# Patient Record
Sex: Male | Born: 1950 | Race: White | Hispanic: No | Marital: Married | Smoking: Never smoker
Health system: Southern US, Academic
[De-identification: ages and names within clinical notes are randomized; demographics above are authoritative.]

## PROBLEM LIST (undated history)

## (undated) DIAGNOSIS — I639 Cerebral infarction, unspecified: Secondary | ICD-10-CM

## (undated) DIAGNOSIS — E782 Mixed hyperlipidemia: Secondary | ICD-10-CM

## (undated) DIAGNOSIS — I10 Essential (primary) hypertension: Secondary | ICD-10-CM

## (undated) HISTORY — PX: HX HERNIA REPAIR: SHX51

---

## 1987-08-03 ENCOUNTER — Inpatient Hospital Stay (HOSPITAL_COMMUNITY): Payer: Self-pay

## 2015-08-27 IMAGING — CR XRAY LUMBAR SPINE COMPLETE
1 series · 4 of 4 positions shown · non-contrast
Comparison: None.

Exam:   
Lumbar spine 4V
INDICATION: Back pain.

[Series 1: view not recorded · oblique · 0.17mm/px · 4 of 4 slices shown]
[im 1/4]
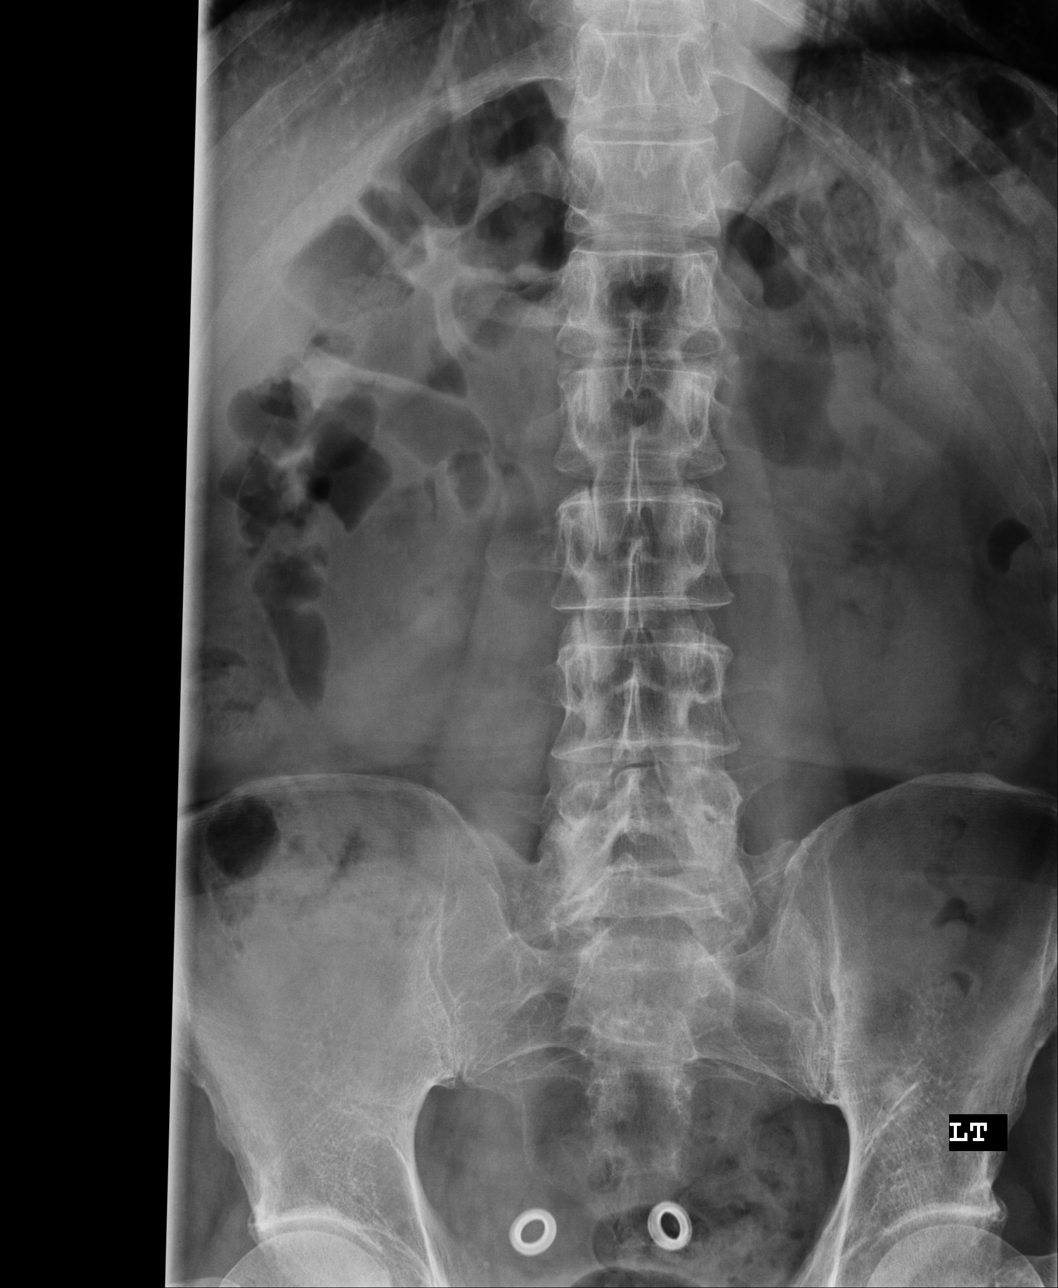
[im 2/4]
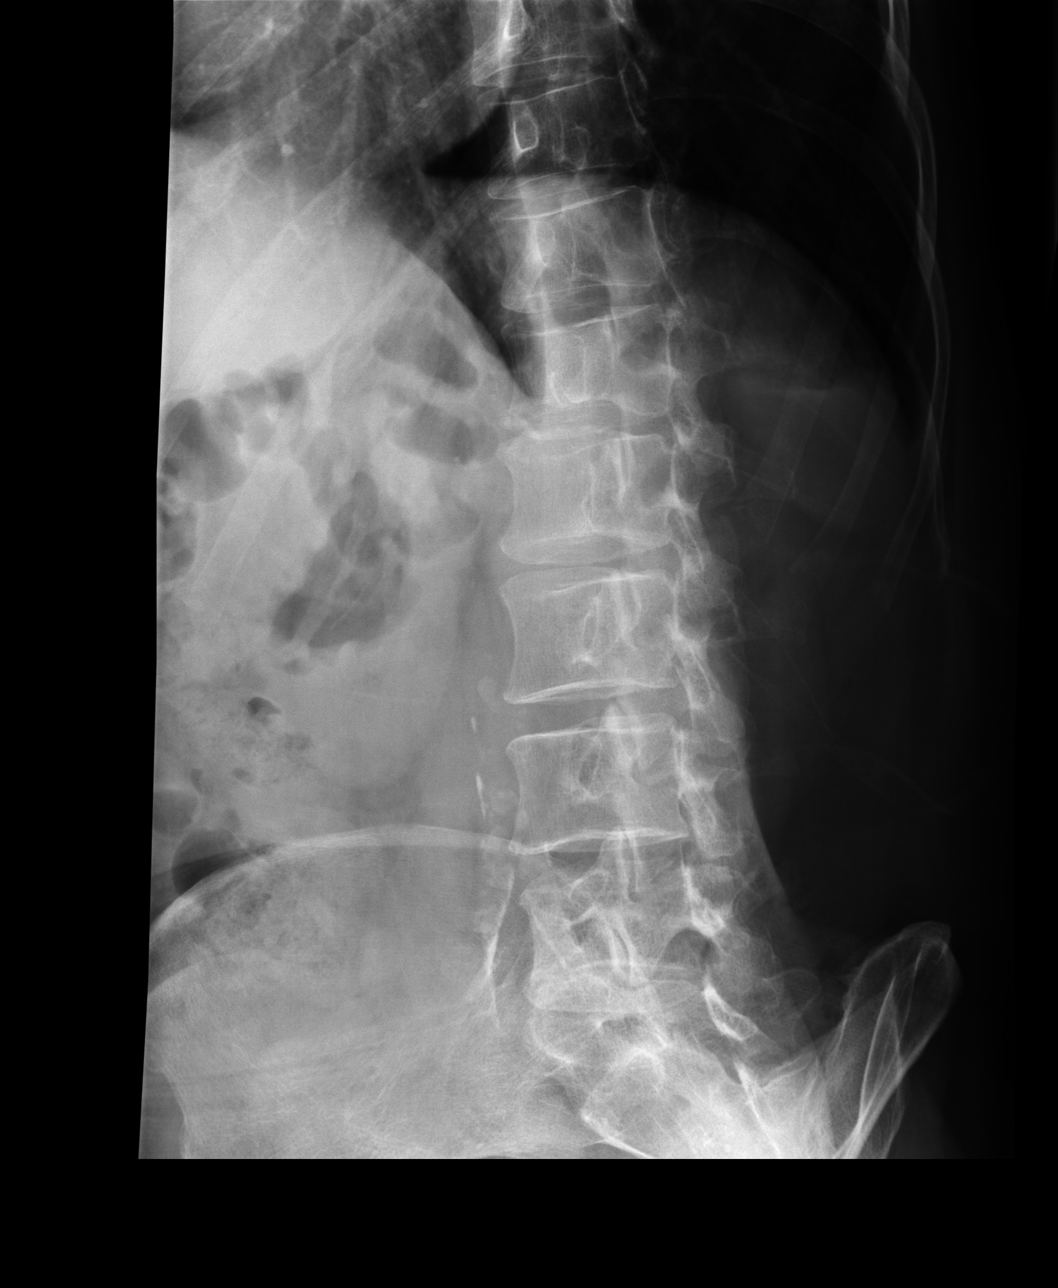
[im 3/4]
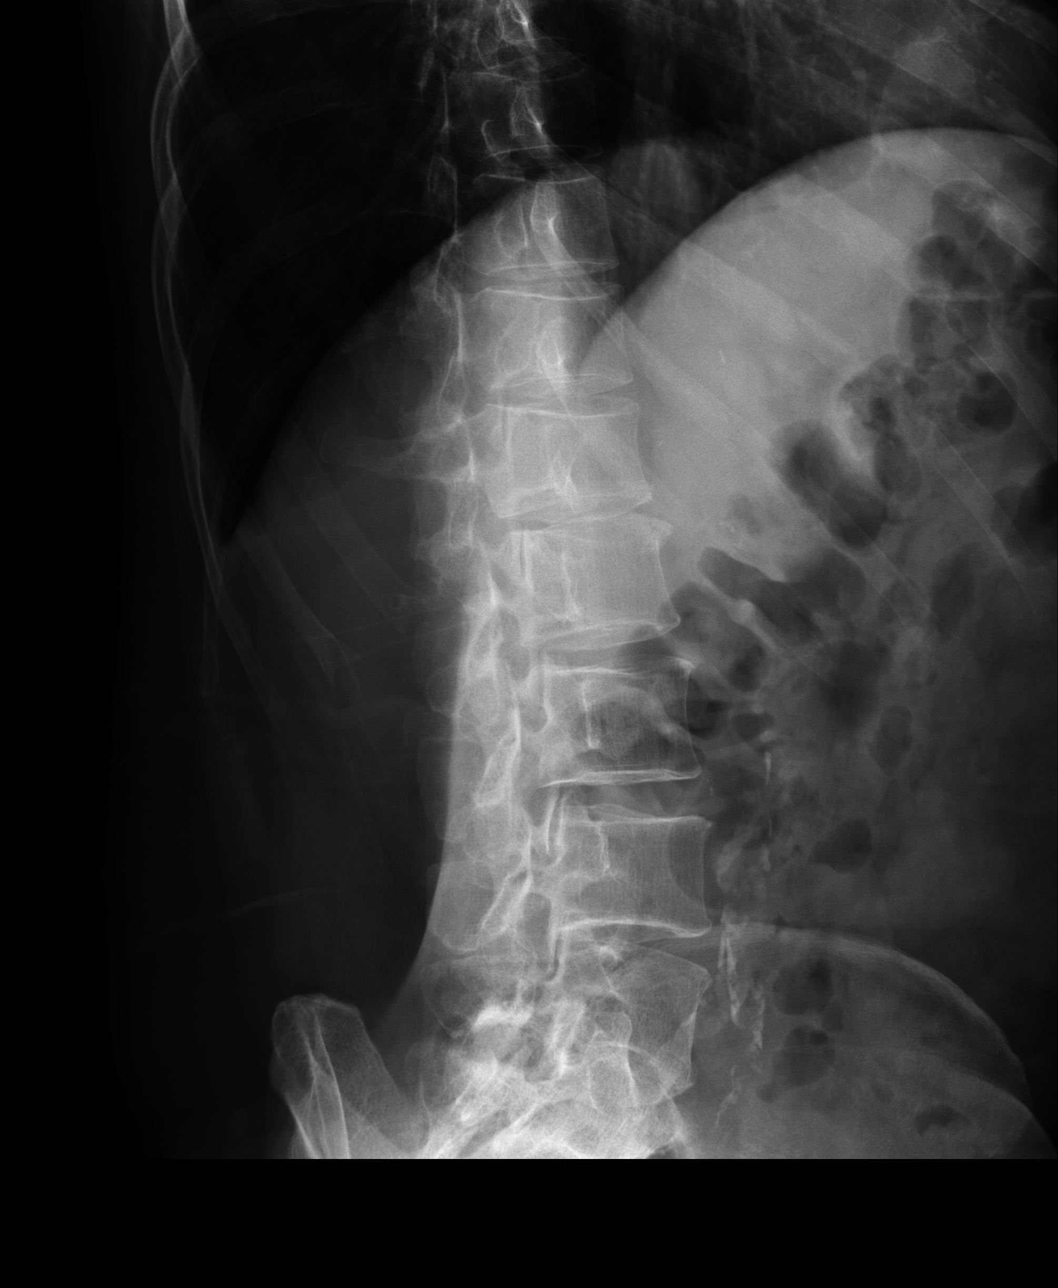
[im 4/4]
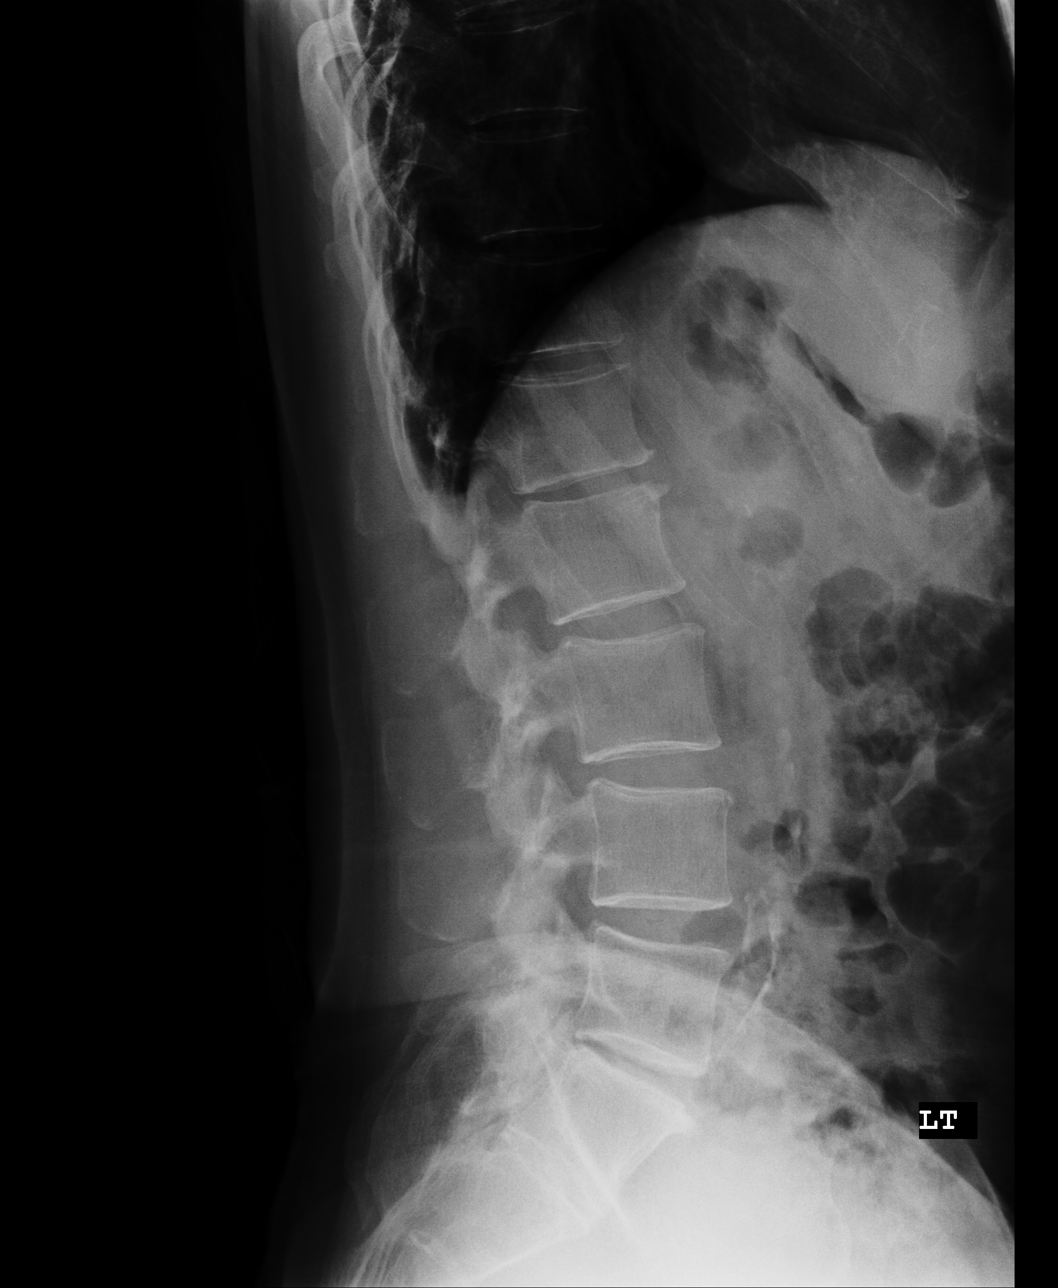

[4 of 4 positions shown; findings below may reference images not displayed]

FINDINGS: There is no acute fracture or subluxation. Mild to moderate disc space narrowing is noted at L1-2 level and moderate disc space narrowing is noted at L5-S1 level. There is also mild facet arthropathy within the lower lumbar spine. There is no definite pars defect on the oblique views. Paraspinal soft tissues are unremarkable. There are extensive vascular calcifications.
IMPRESSION: 1.
Arthritic changes as detailed above. Please consider further evaluation with MRI for persistent or worsening symptoms.

## 2018-01-04 IMAGING — US US ABDOMEN COMPLETE
1 series · 14 of 25 positions shown · non-contrast
Comparison: None available.

EXAM:  US ABDOMEN COMPLETE
INDICATION: Mid abdominal pain.

[Series 4: us abdomen complete · 0.29mm/px · 14 of 50 slices shown]
[im 1/50]
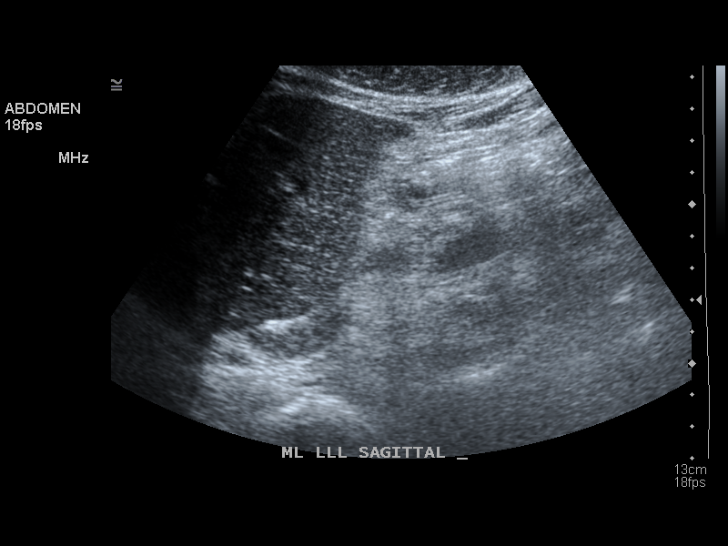
[im 5/50]
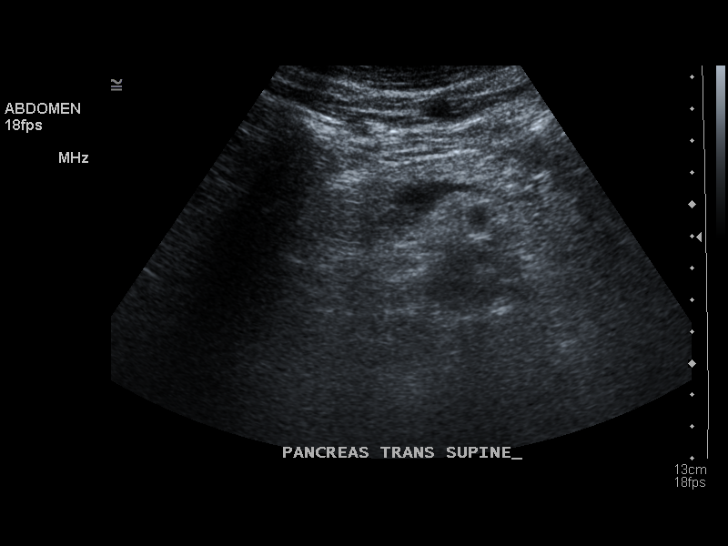
[im 9/50]
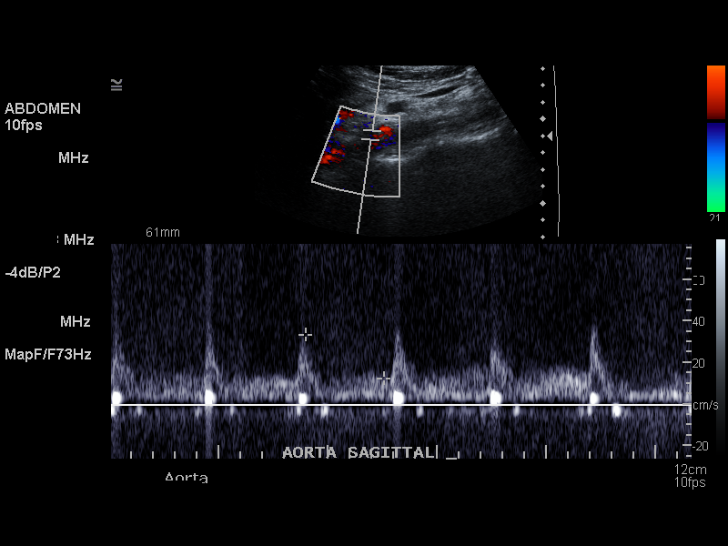
[im 13/50]
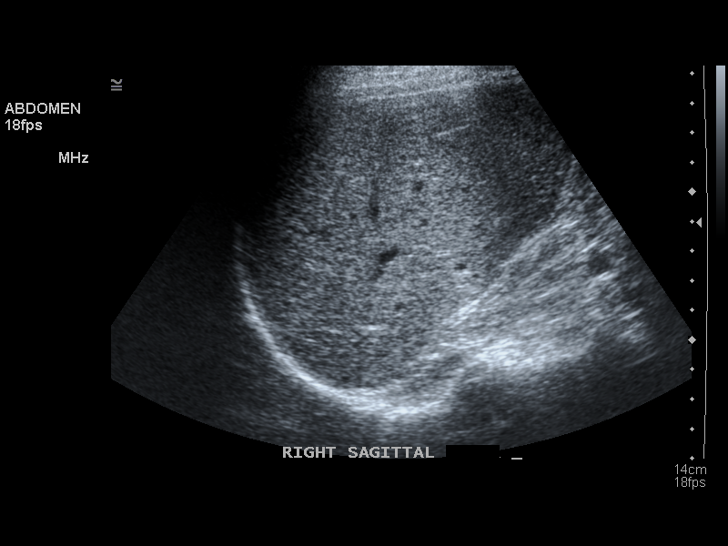
[im 17/50]
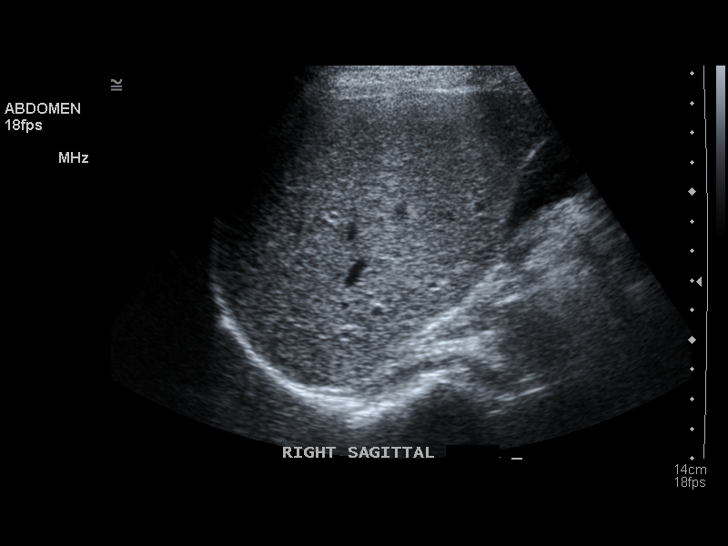
[im 19/50]
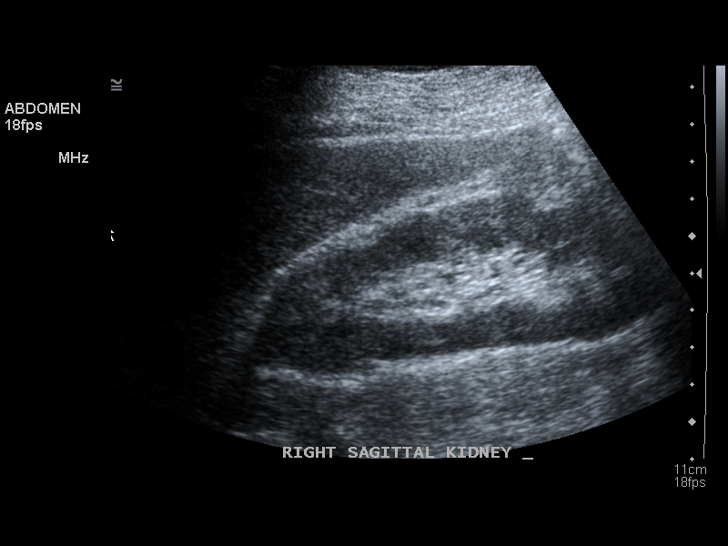
[im 23/50]
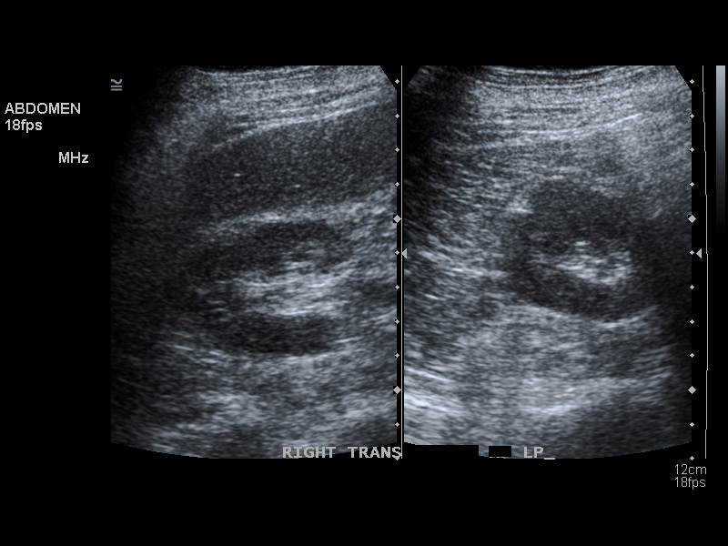
[im 27/50]
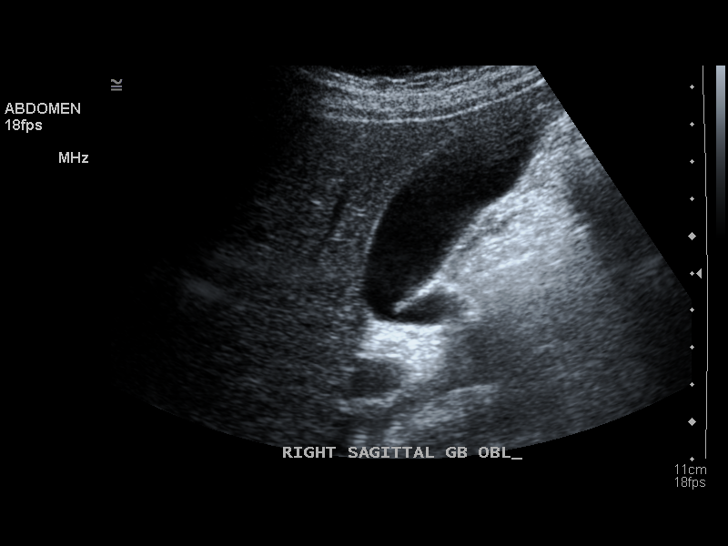
[im 31/50]
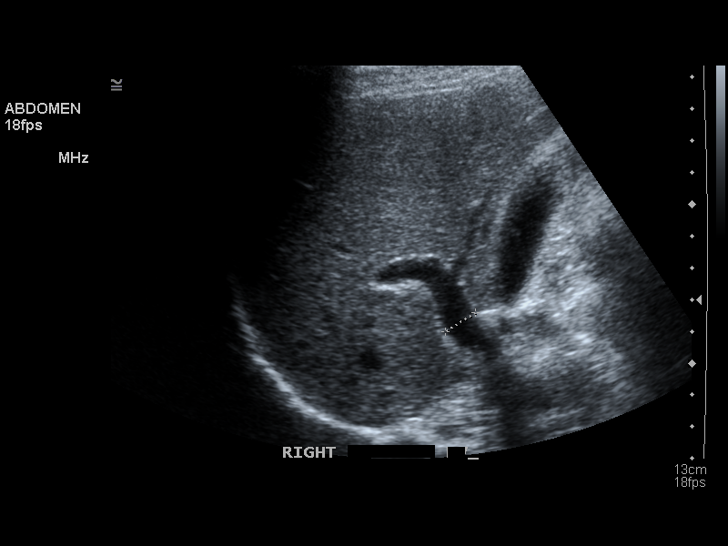
[im 33/50]
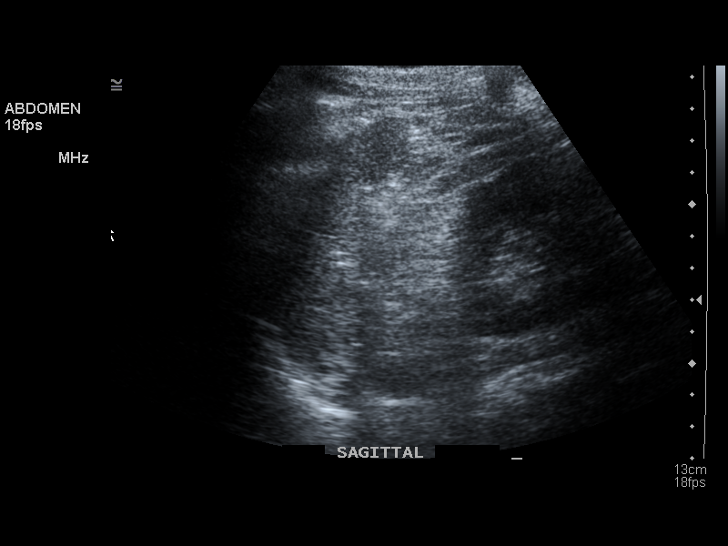
[im 37/50]
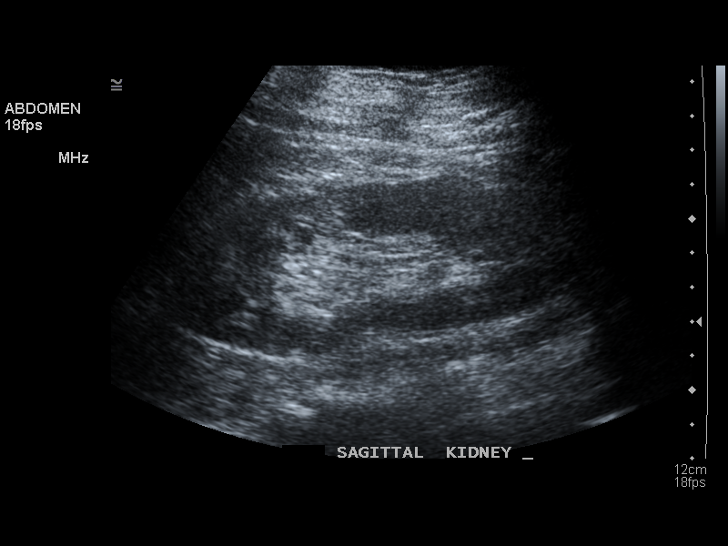
[im 41/50]
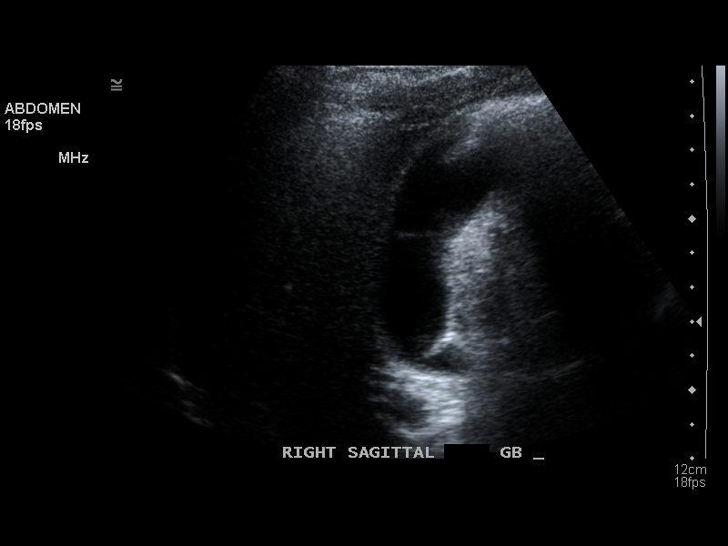
[im 45/50]
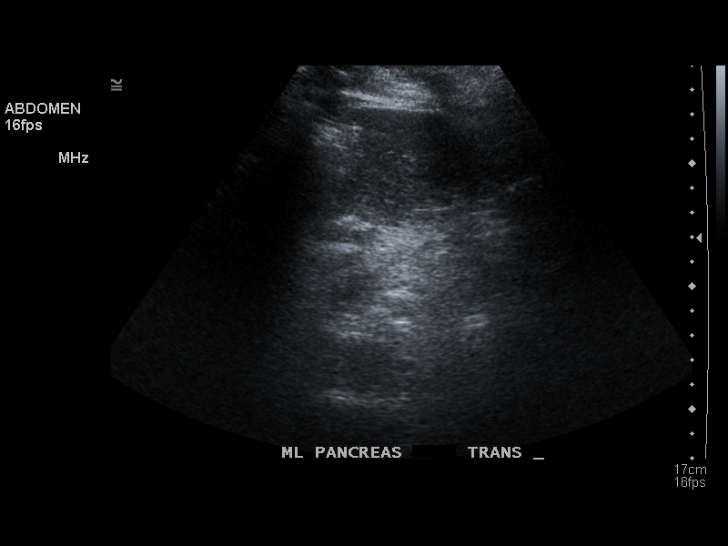
[im 50/50]
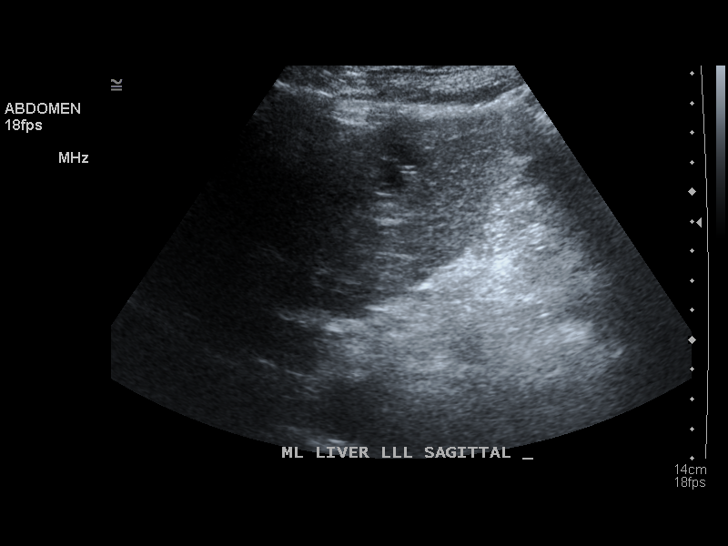

[14 of 25 positions shown; findings below may reference images not displayed]

FINDINGS: Liver is normal in echogenicity. There is an 18 mm inferior right hepatic lobe septated cyst. There is no intra or extrahepatic biliary ductal dilatation. Common bile duct measures 3 mm. No sludge or shadowing gallstones are seen. There is no gallbladder wall thickening or pericholecystic fluid. Pancreas is incompletely visualized due to artifact from overlying bowel gas. Spleen measures 9 cm and is unremarkable.

Kidneys are normal in echogenicity and measure 11 cm bilaterally. There is no hydronephrosis, mass or cyst on either side.

Visualized abdominal aorta is without aneurysmal dilatation. IVC is normal. There is no ascites.
IMPRESSION: 1. A septated 18 mm inferior right hepatic lobe cyst. 

2. No evidence of cholelithiasis or acute cholecystitis. 

3. Pancreas incompletely visualized due to artifact from overlying bowel gas.

## 2018-01-04 IMAGING — CR XRAY UPPER GI TRACT W/SM INT [PERSON_NAME]/[PERSON_NAME]
1 series · 7 of 7 positions shown · non-contrast
Comparison: None available.

EXAM:  XRAY UPPER GI TRACT W/SM INT KLEVER/[id number]
INDICATION: Abdominal pain and bloating.

[Series 1: view not recorded · 0.17mm/px · 7 of 7 slices shown]
[im 1/7]
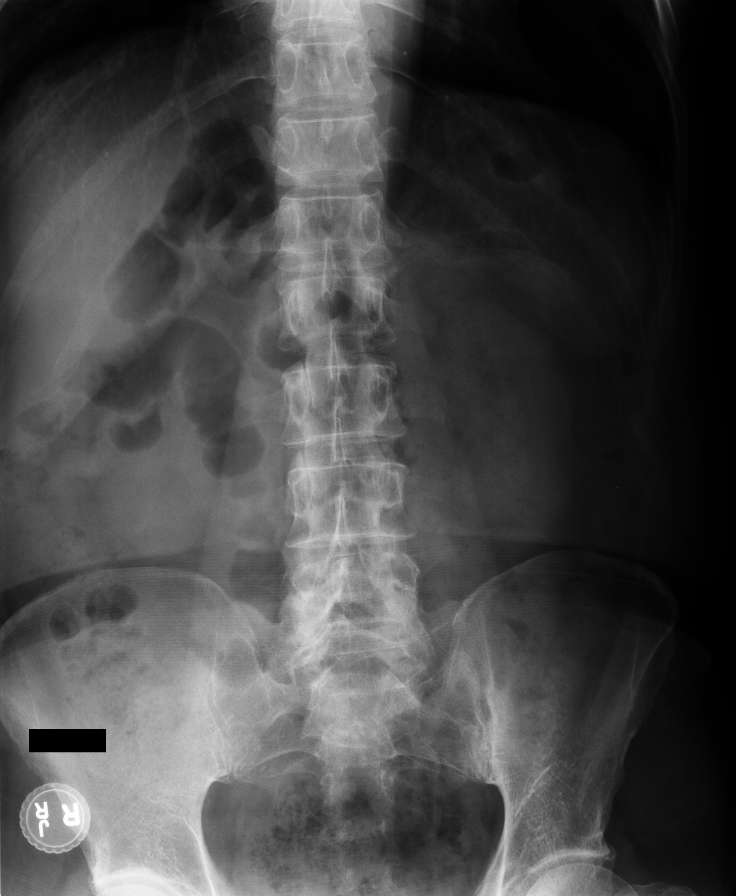
[im 2/7]
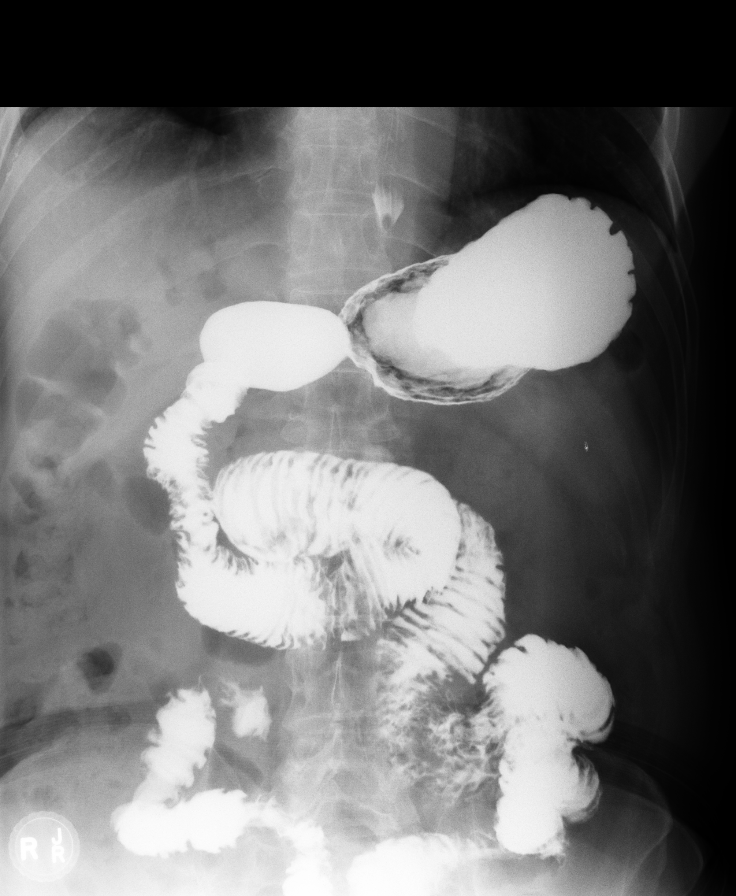
[im 3/7]
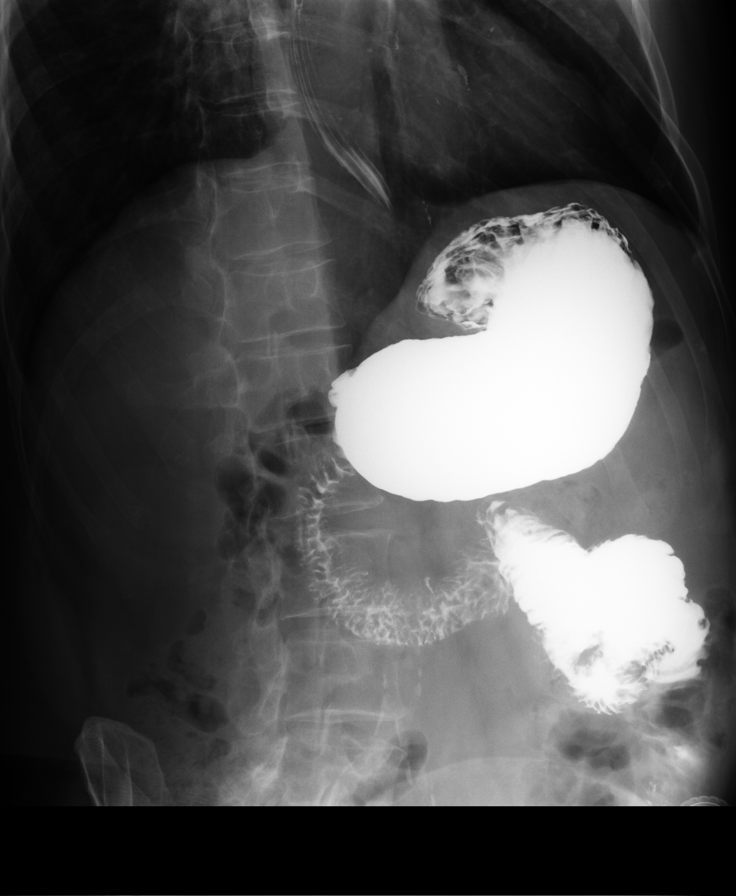
[im 4/7]
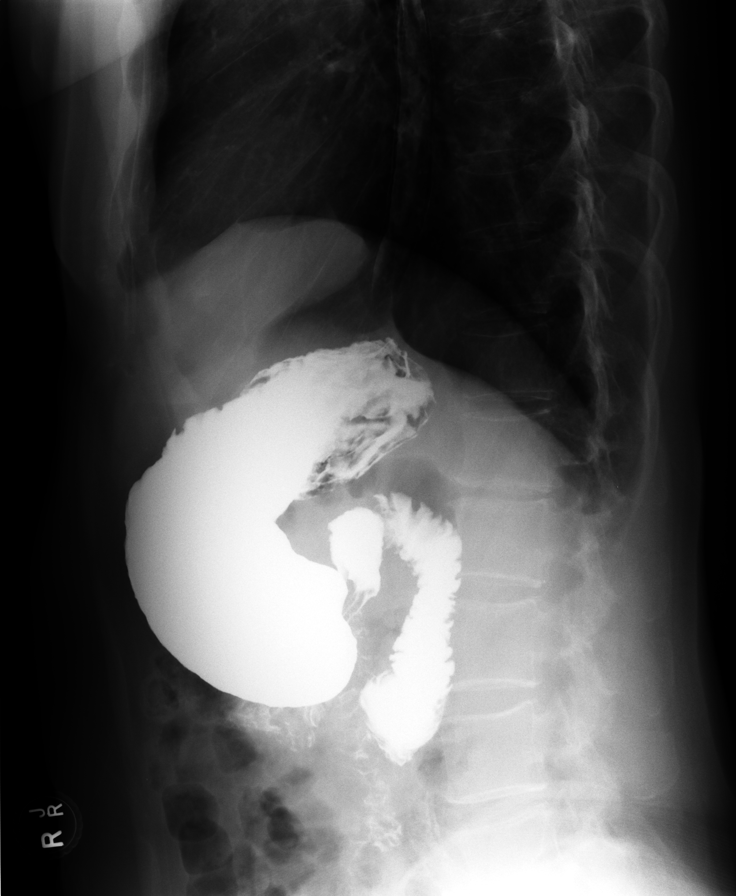
[im 5/7]
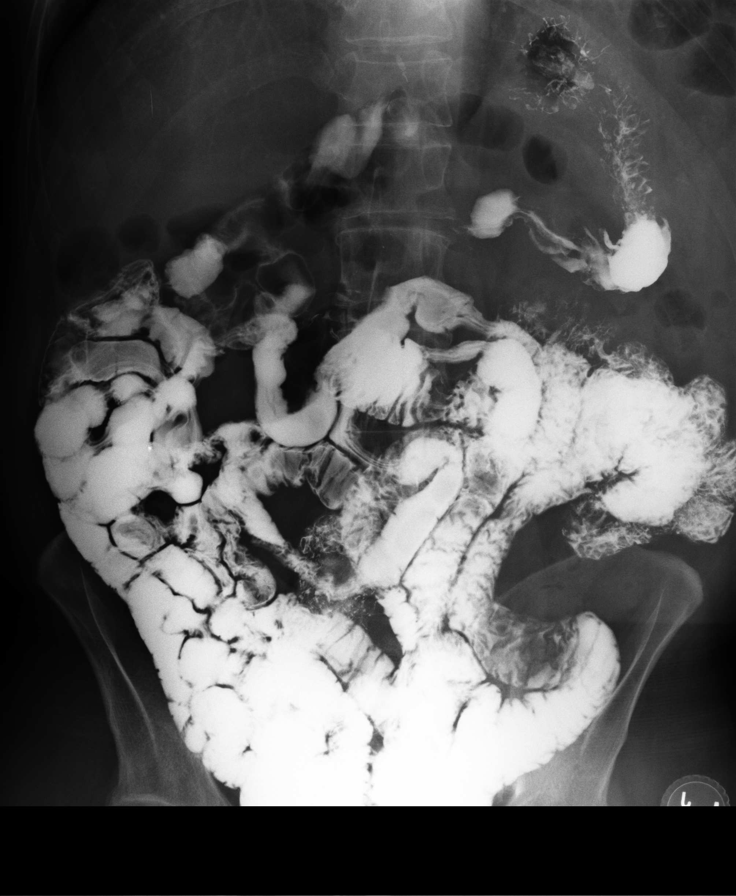
[im 6/7]
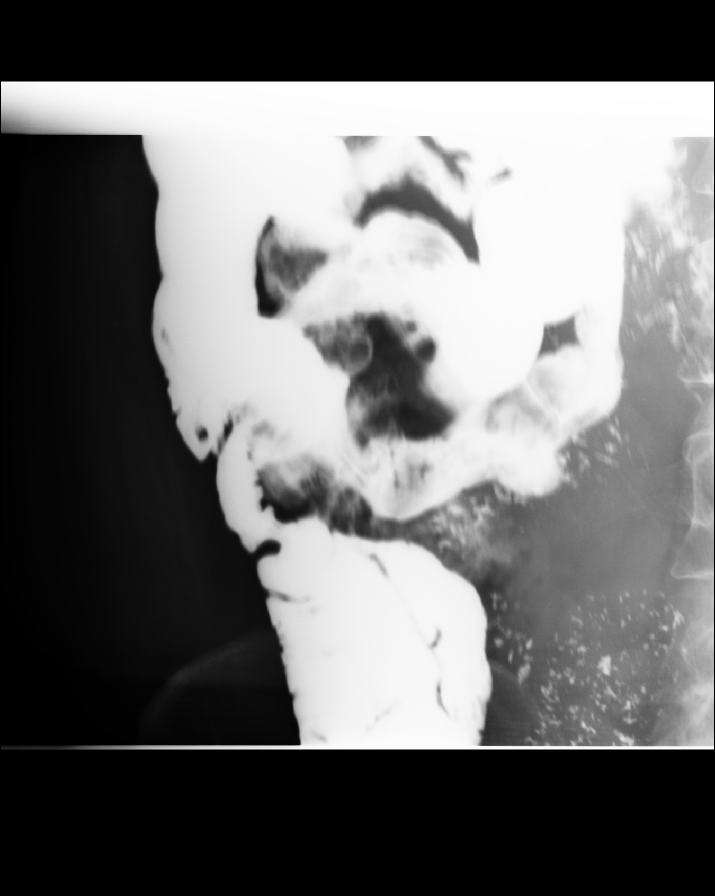
[im 7/7]
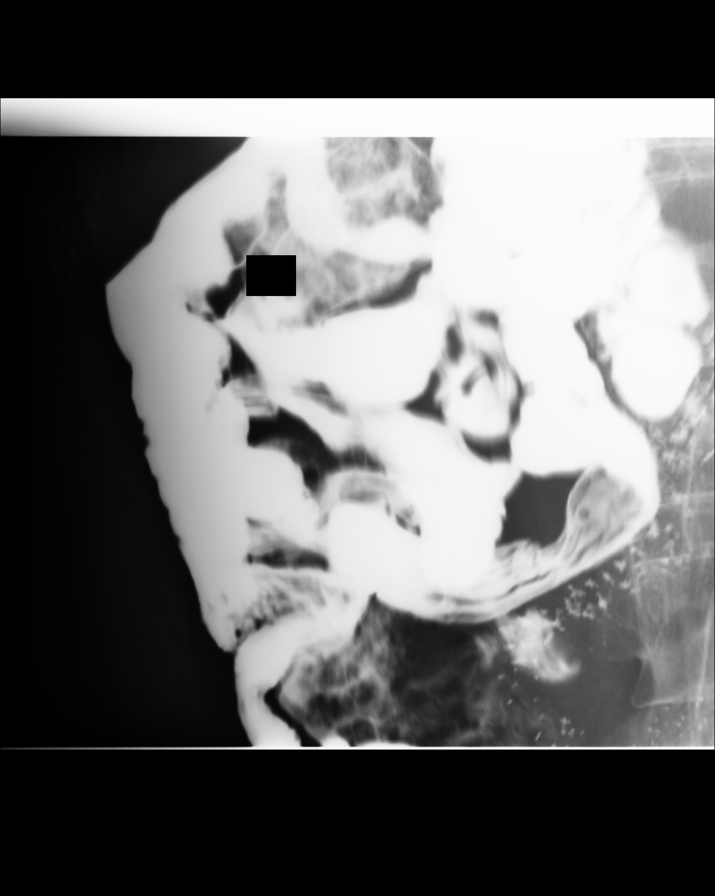

[7 of 7 positions shown; findings below may reference images not displayed]

FINDINGS: A double contrast upper gastrointestinal series was performed with barium and air administered orally under fluoroscopic control.

The esophageal is unremarkable in appearance and peristaltic activity. There is no mass, ulcer or stricture. There is no evidence of hiatal hernia. No gastroesophageal reflux is noted during the exam.

The stomach has normal mucosal fold pattern without mass or ulceration. The duodenal bulb is also normal in configuration without ulceration.

Sequential films were obtained through the small bowel which demonstrate a normal transit time. The mucosal pattern is unremarkable and there are no abnormally dilated or separated bowel loops. No abnormally matted or angulated small bowel loops are seen. The terminal ileum has a normal appearance.

Fluoroscopy time is 1.5 min.
IMPRESSION: Unremarkable exam.

## 2019-01-17 IMAGING — US US ABDOMEN COMPLETE
1 series · 14 of 25 positions shown · non-contrast
Comparison: Abdominal sonogram dated 01/04/2018.

EXAM:  US ABDOMEN COMPLETE
INDICATION: Hepatic cyst.

[Series 4: us abdomen complete · 0.20mm/px · 14 of 58 slices shown]
[im 1/58]
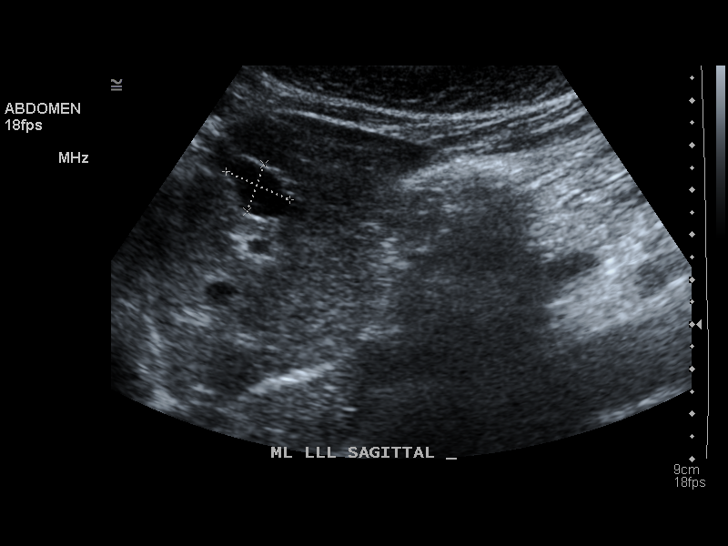
[im 5/58]
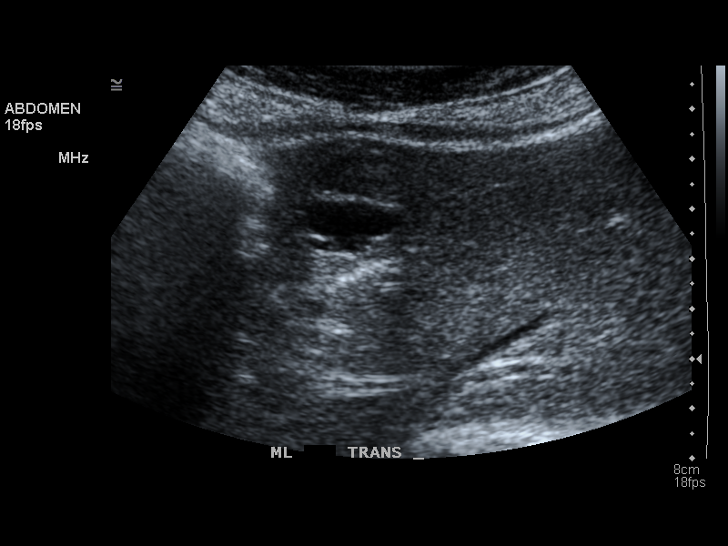
[im 10/58]
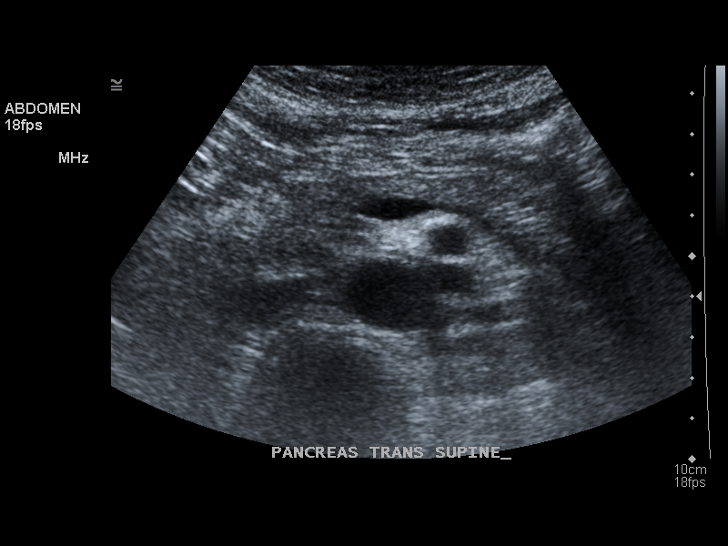
[im 15/58]
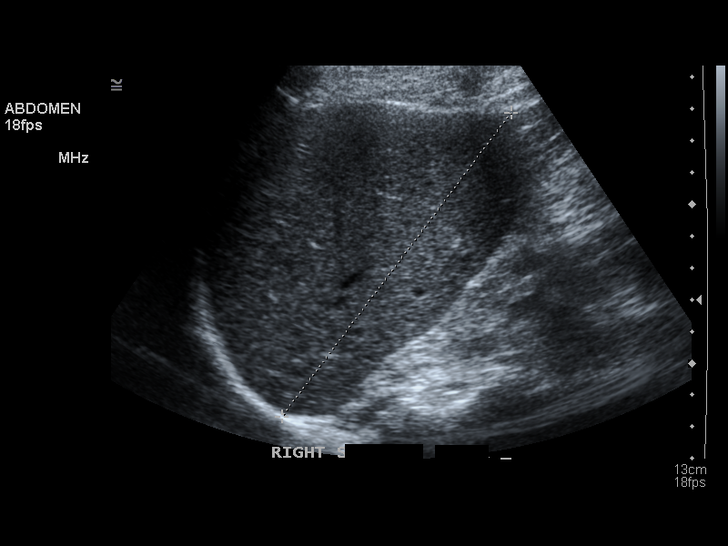
[im 20/58]
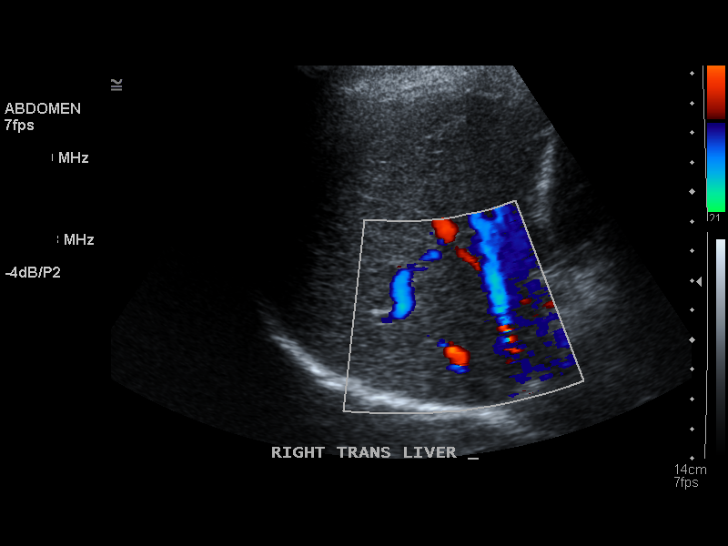
[im 22/58]
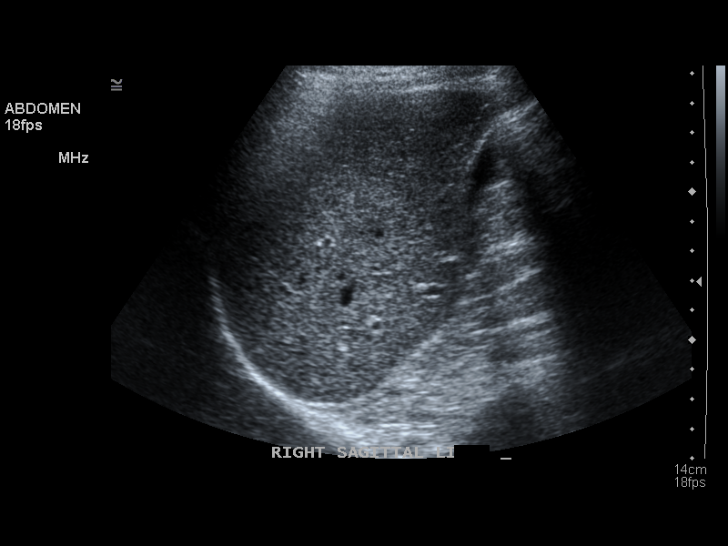
[im 27/58]
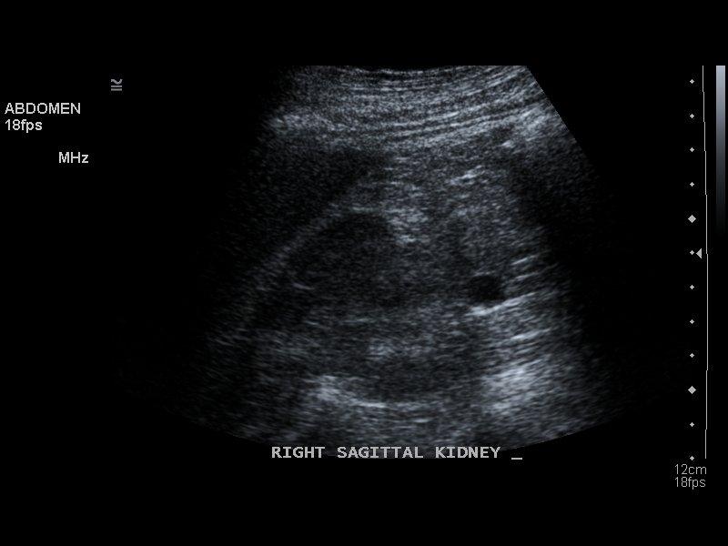
[im 31/58]
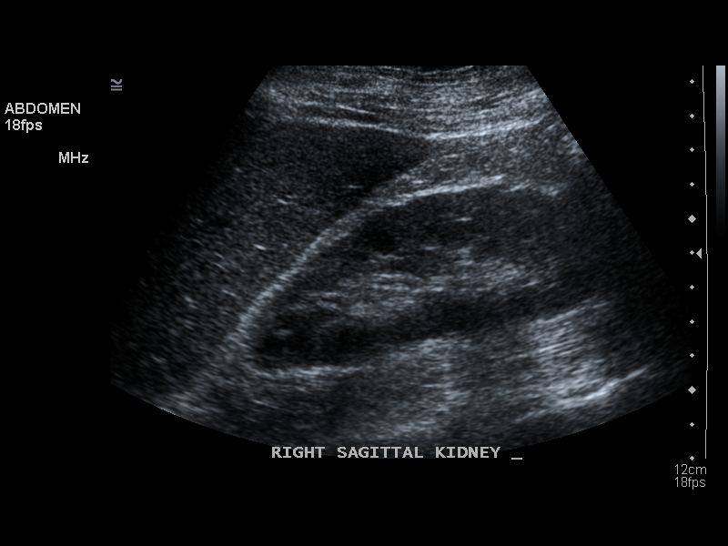
[im 36/58]
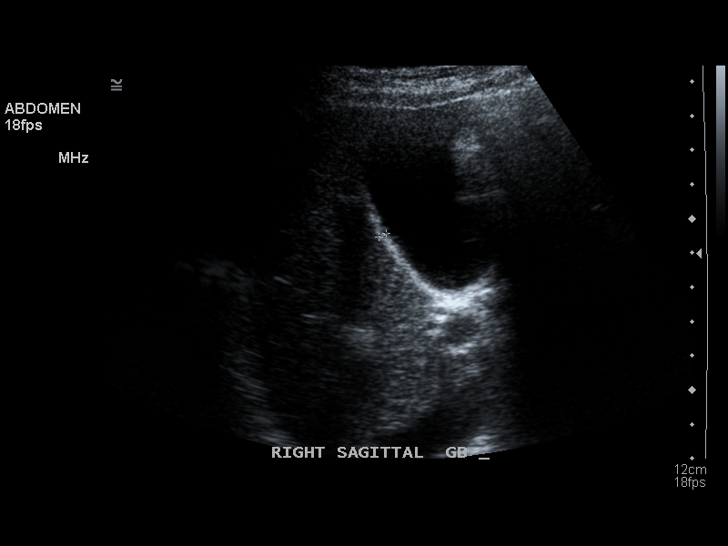
[im 39/58]
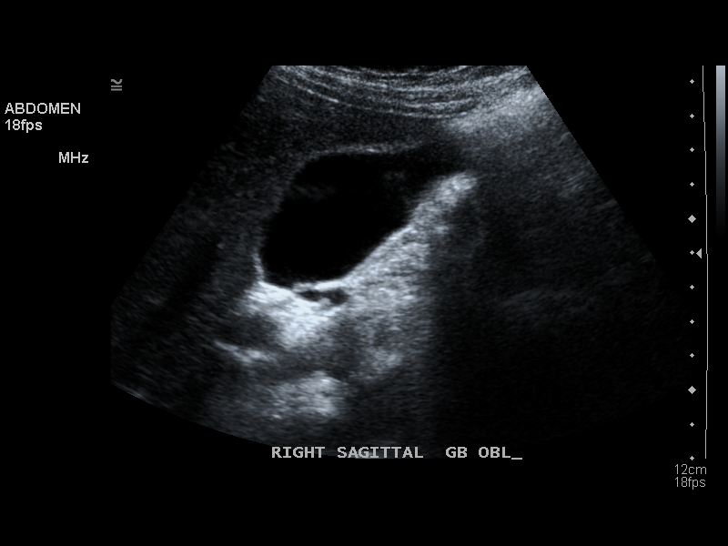
[im 43/58]
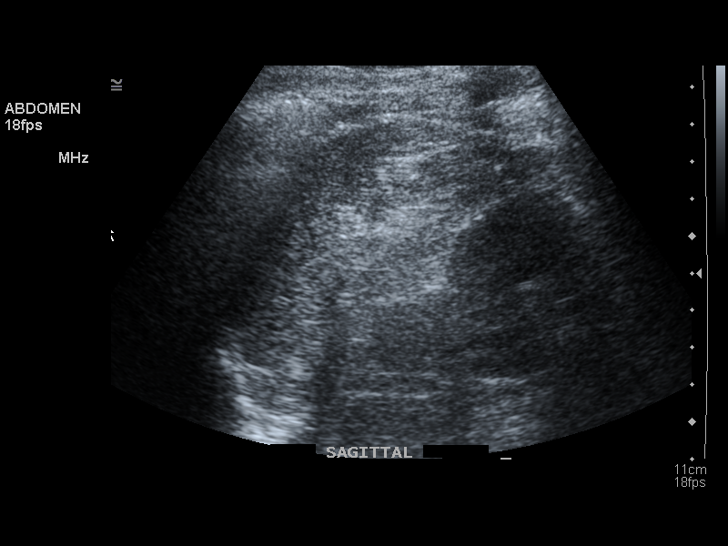
[im 48/58]
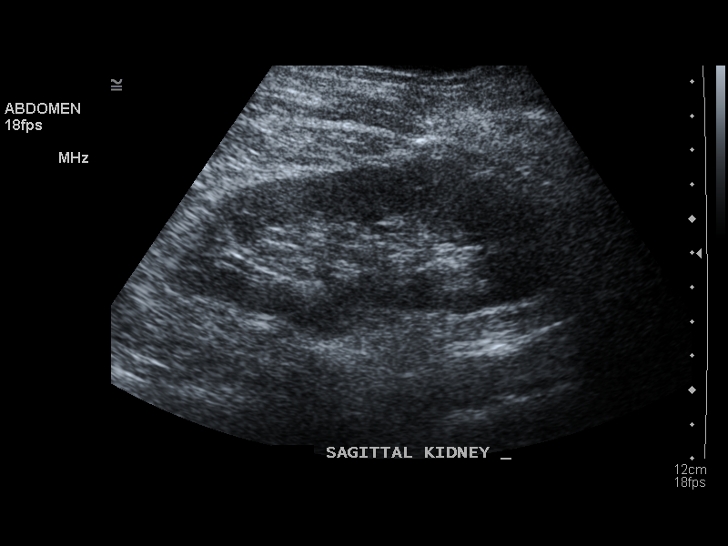
[im 53/58]
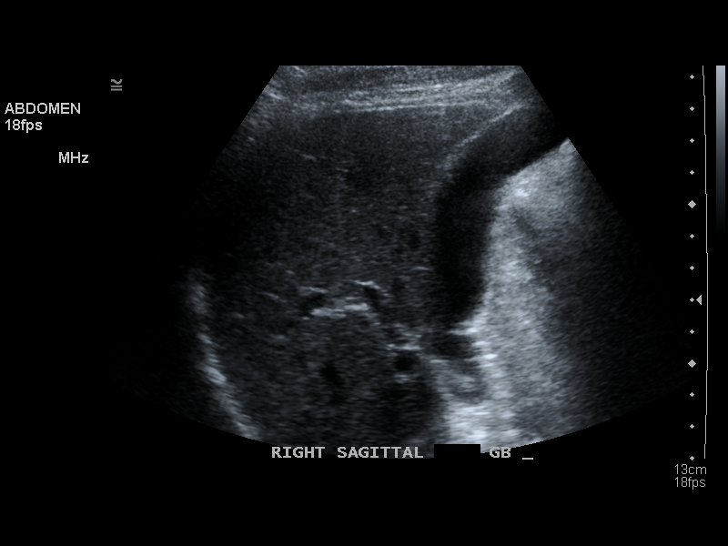
[im 58/58]
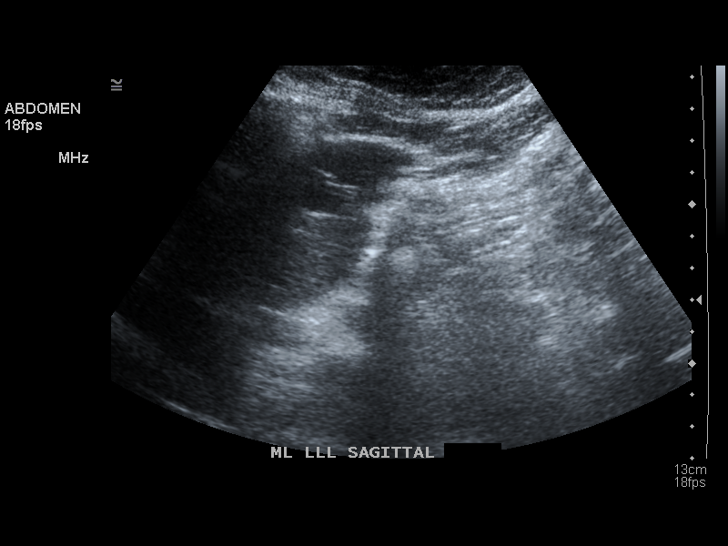

[14 of 25 positions shown; findings below may reference images not displayed]

FINDINGS: Liver is normal in echogenicity. A 20 mm septated hepatic cyst is stable. There is no intra or extrahepatic biliary ductal dilatation. Common bile duct measures 3 mm. No sludge or shadowing gallstones are seen. There is no gallbladder wall thickening or pericholecystic fluid. Pancreas is incompletely visualized due to artifact from overlying bowel gas. Spleen measures 7.5 cm and is unremarkable.

Kidneys are normal in echogenicity. Right kidney measures 13 cm and left kidney measures 12 cm. There is a 10 mm right kidney interpolar region cyst.

Visualized abdominal aorta is without aneurysmal dilatation. IVC is normal. There is no ascites.
IMPRESSION: 1. Stable 20 mm septated hepatic cyst. 

2. No evidence of cholelithiasis or acute cholecystitis. 

3. Pancreas incompletely visualized due to artifact from overlying bowel gas.

## 2020-01-20 IMAGING — US US ABDOMEN COMPLETE
1 series · 14 of 25 positions shown · non-contrast
Comparison: None.

EXAM:  US ABDOMEN COMPLETE
INDICATION: Hepatic cyst.

[Series 4: us abdomen complete · 0.22mm/px · 14 of 62 slices shown]
[im 1/62]
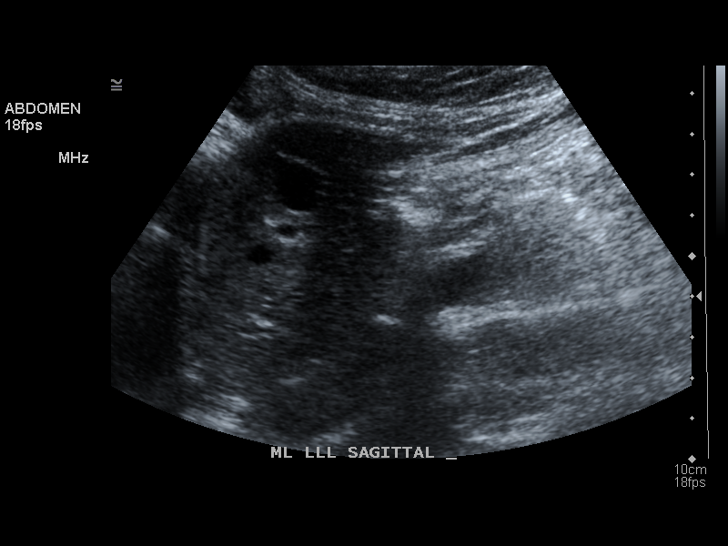
[im 6/62]
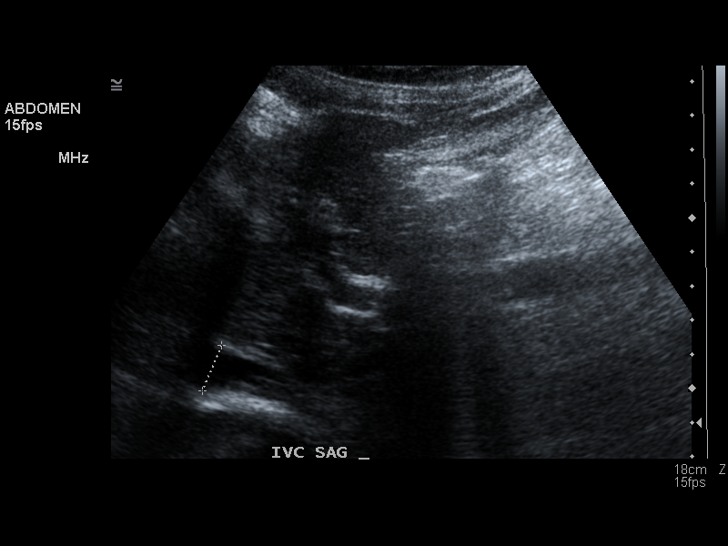
[im 11/62]
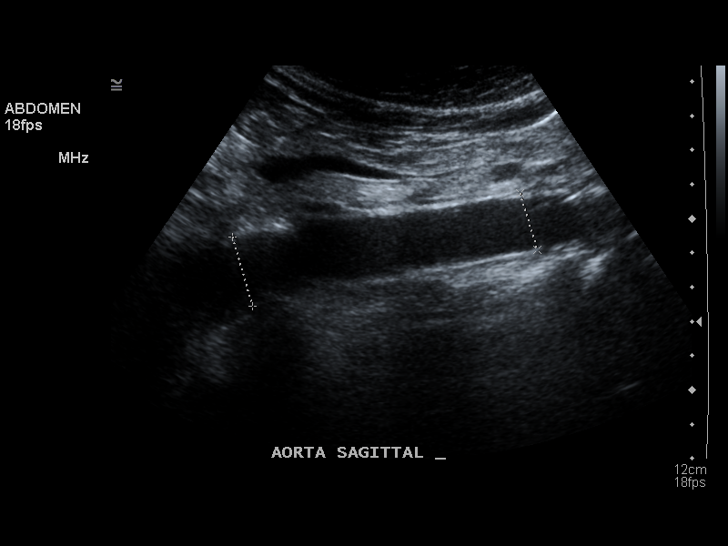
[im 16/62]
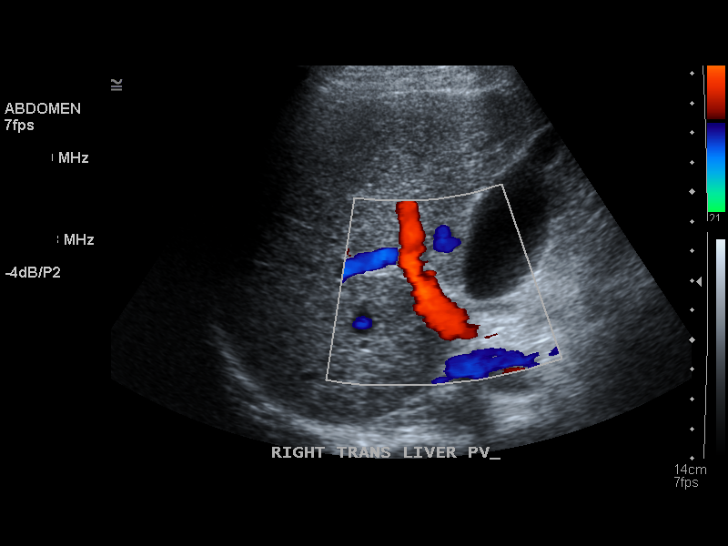
[im 21/62]
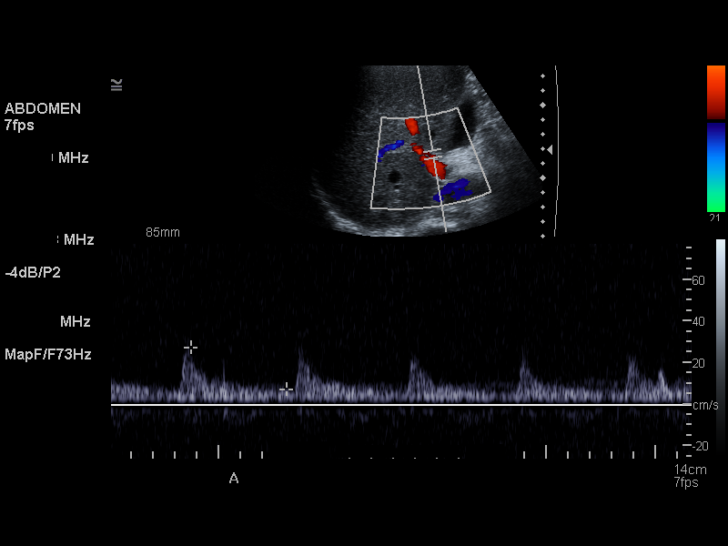
[im 23/62]
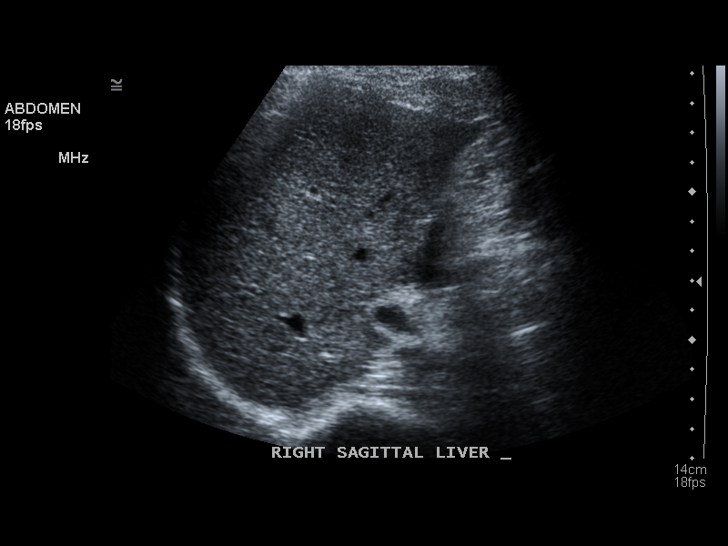
[im 28/62]
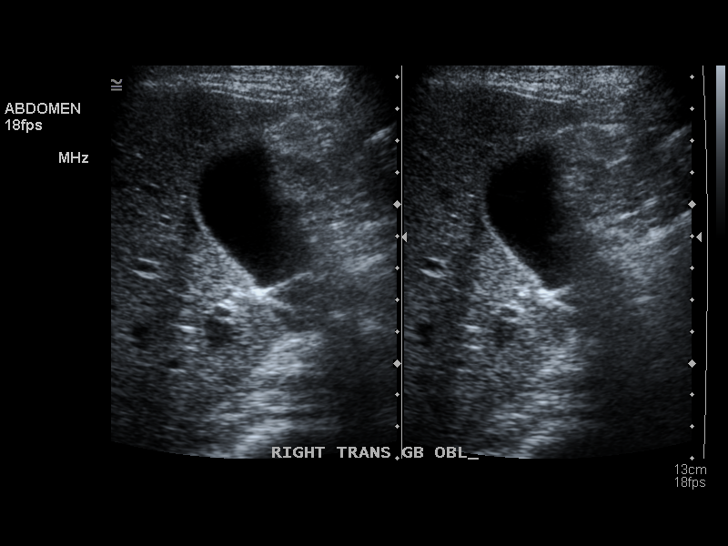
[im 34/62]
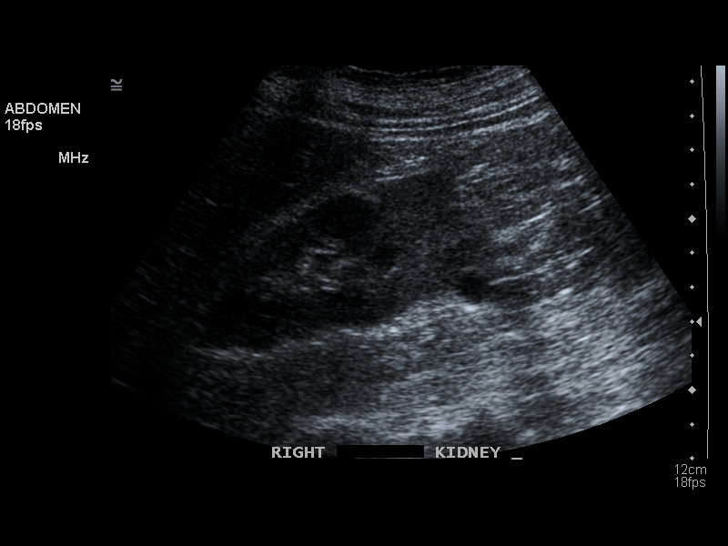
[im 39/62]
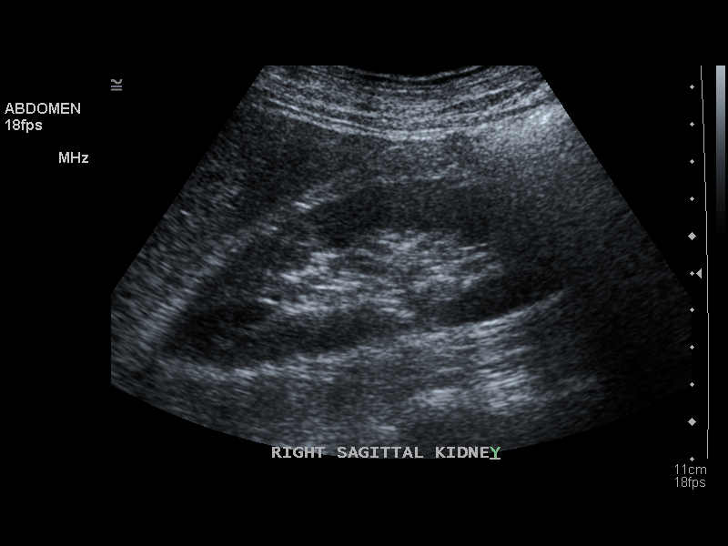
[im 41/62]
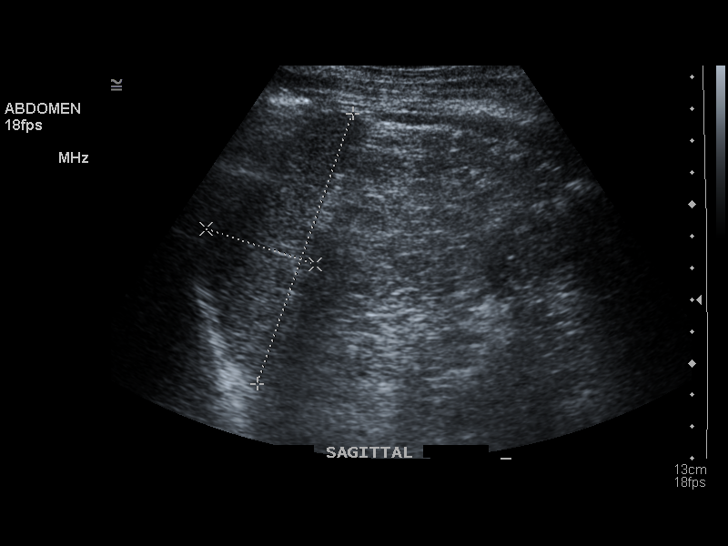
[im 46/62]
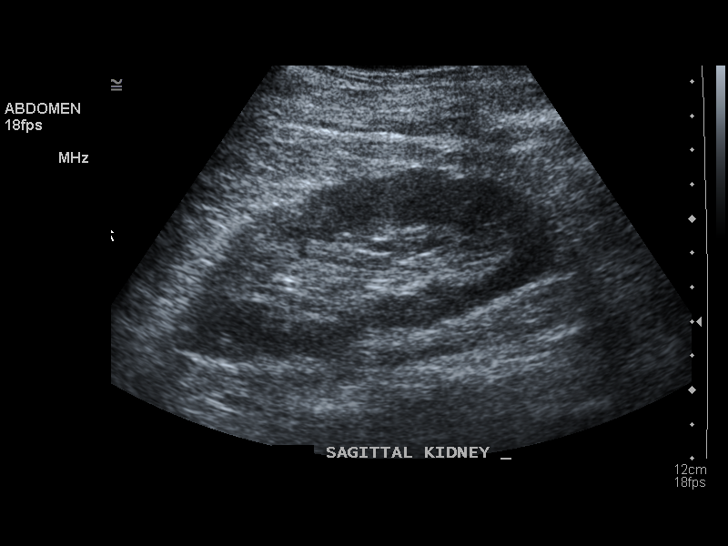
[im 51/62]
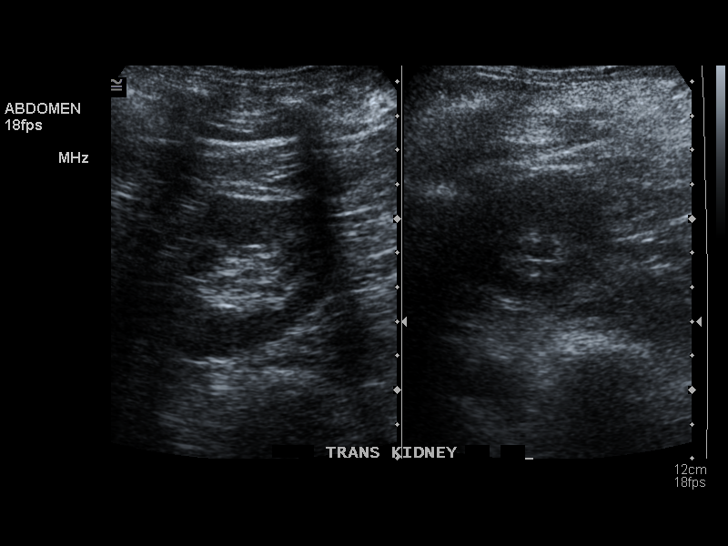
[im 56/62]
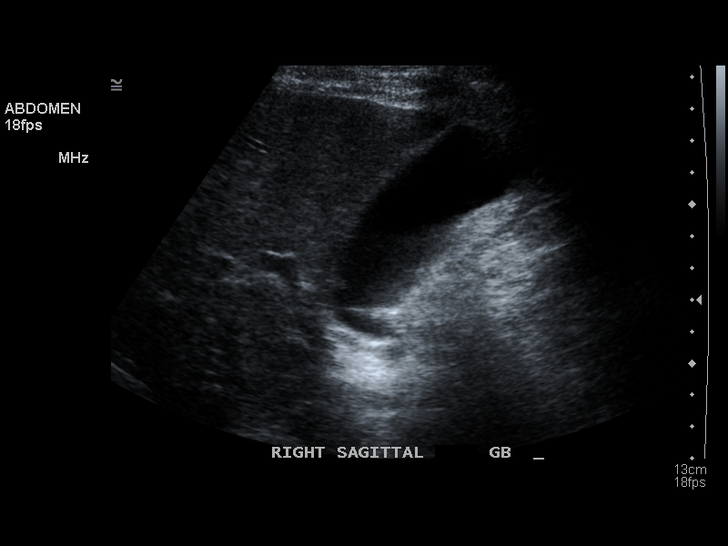
[im 62/62]
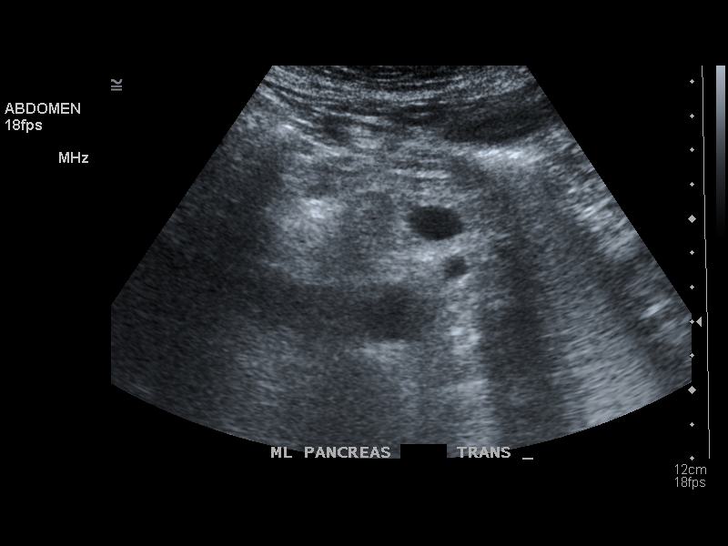

[14 of 25 positions shown; findings below may reference images not displayed]

FINDINGS: The liver measures 12.4 cm.  In the left lobe of the liver there is a 1.5 x 1.2 x 1.8 cm septated cyst.  The portal vein measures 1.4 cm and shows hepatopetal flow.  No echogenic filling defects are seen in the gallbladder.  The gallbladder wall is 0.13 cm in thickness.  The caliber of the common bile duct is 0.3 cm.  Supine views of the pancreas show the pancreas to be normal in size, shape and contour.  The caliber of the aorta and inferior vena cava is normal where measured.  The spleen measures 9 cm.  The right kidney measures 11 cm and in the lower pole there is a 1.2 x 1.0 x 1.0 cm cyst.  The left kidney measures 11.6 cm.  No hydronephrosis is observed.
IMPRESSION: Small cyst in the left lobe of the liver and in the lower pole of the right kidney. 

Otherwise, unremarkable abdominal ultrasound.

## 2021-02-07 IMAGING — US US ABDOMEN COMPLETE
1 series · 14 of 25 positions shown · non-contrast
Comparison: Sonogram dated 01/20/2020.

﻿EXAM: US ABDOMEN COMPLETE
INDICATION: History of liver cysts.

[Series 1: us abdomen complete · 14 of 66 slices shown]
[im 1/66]
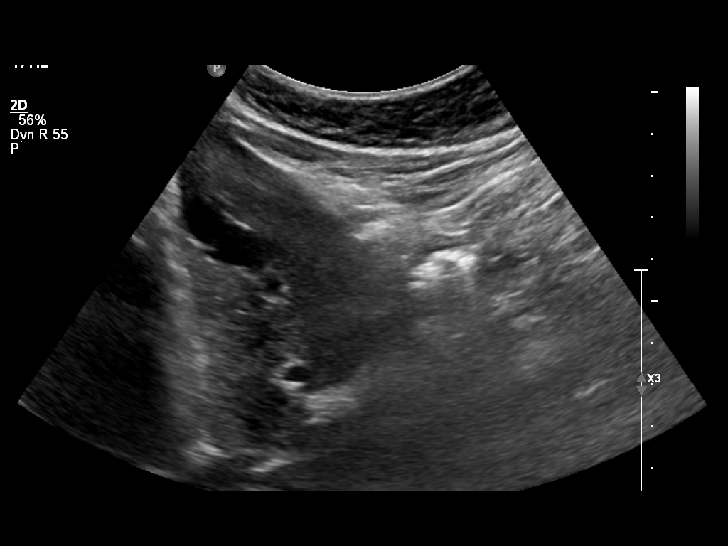
[im 6/66]
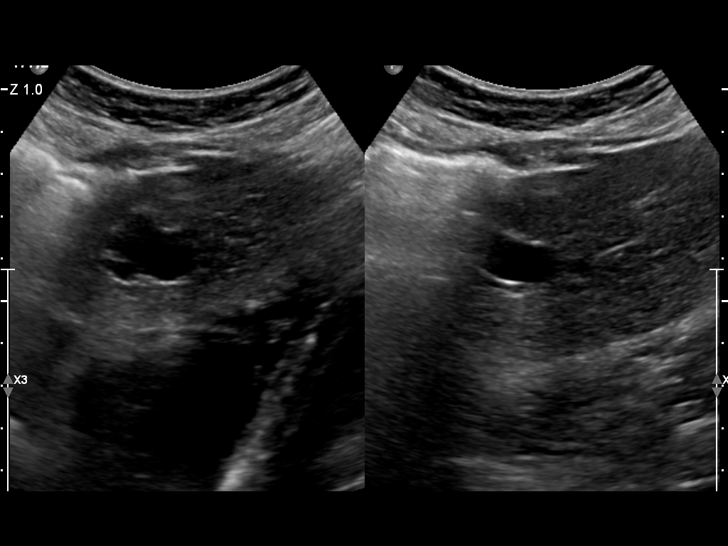
[im 11/66]
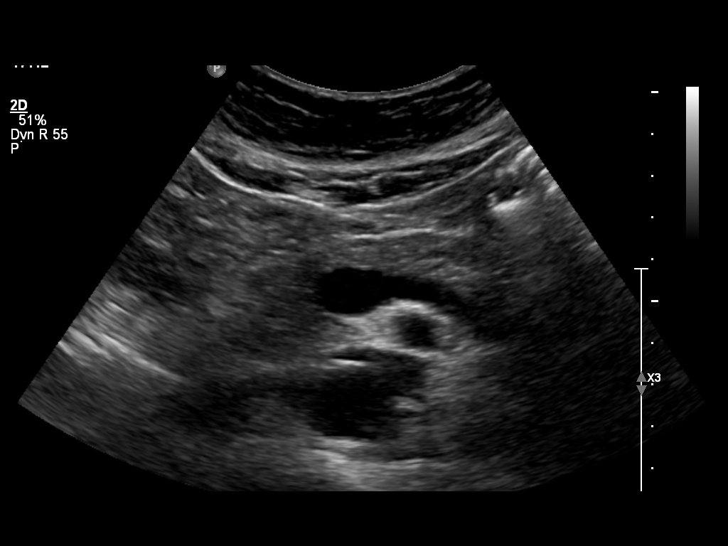
[im 17/66]
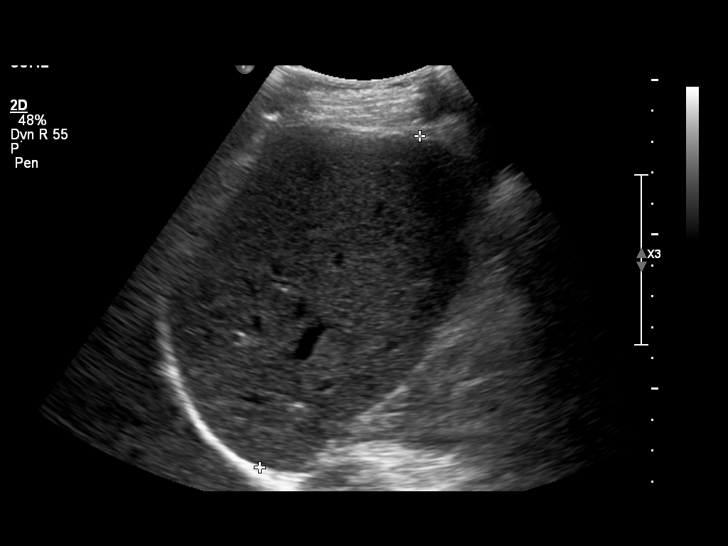
[im 22/66]
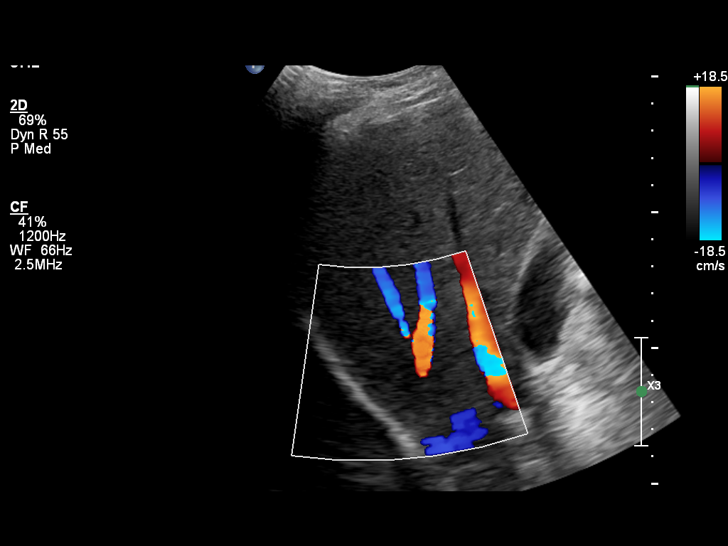
[im 25/66]
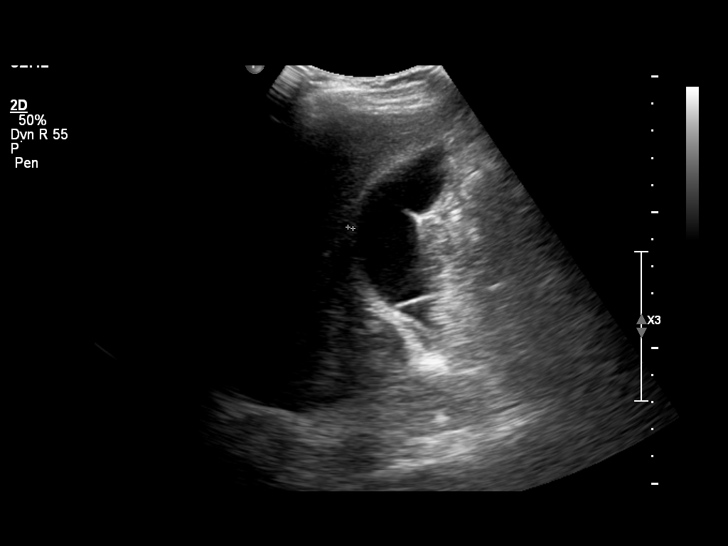
[im 30/66]
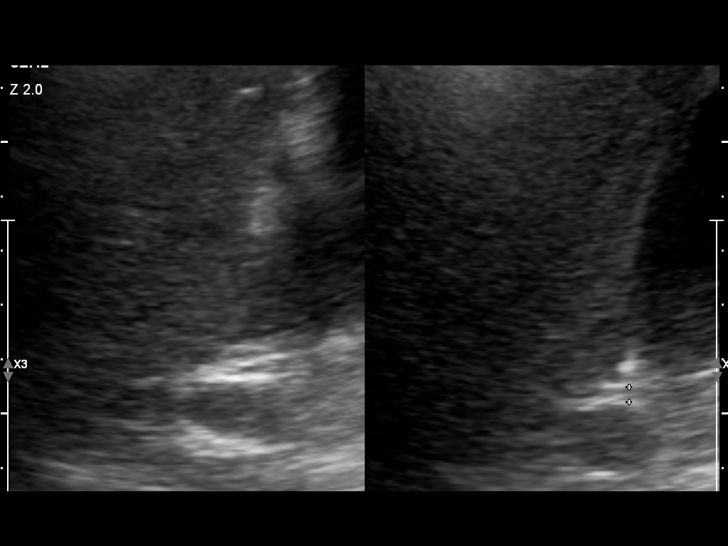
[im 36/66]
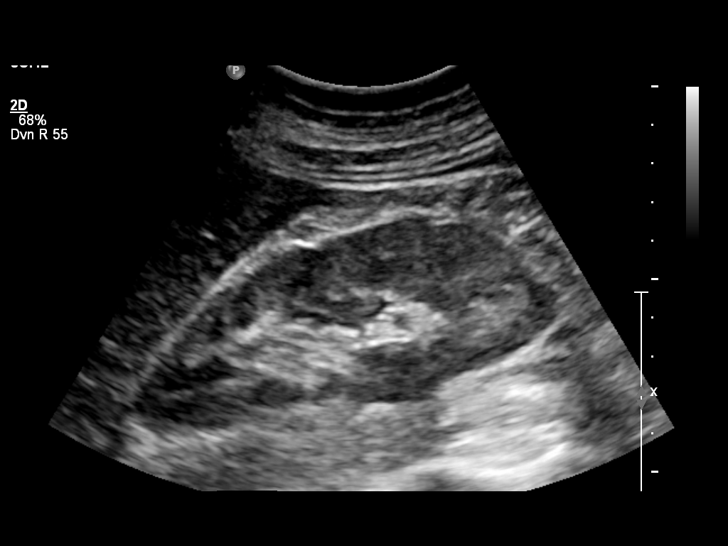
[im 41/66]
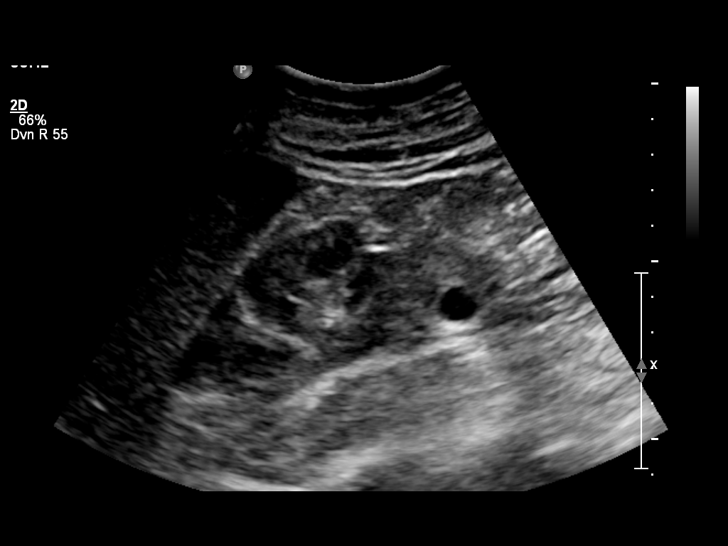
[im 44/66]
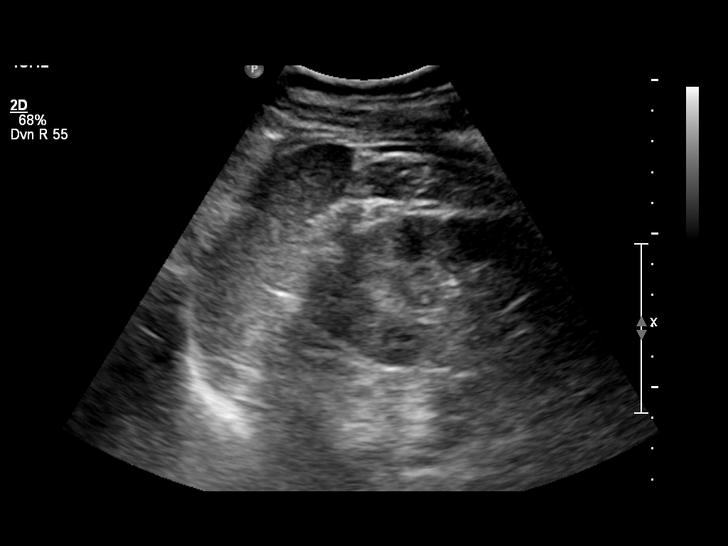
[im 49/66]
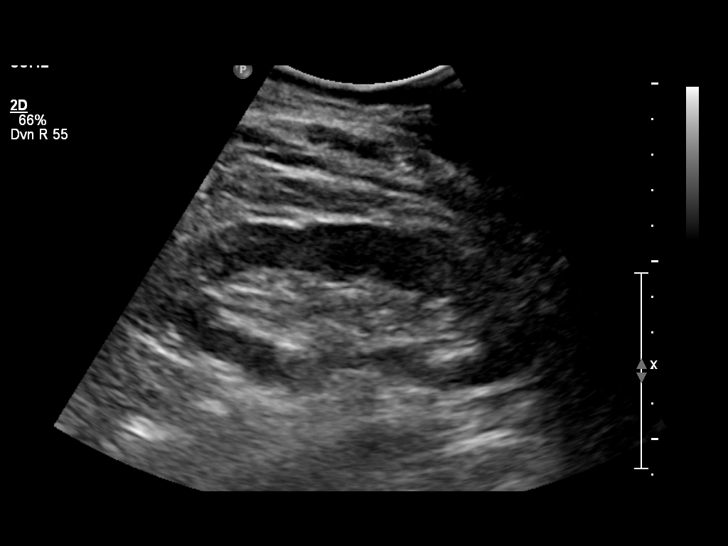
[im 55/66]
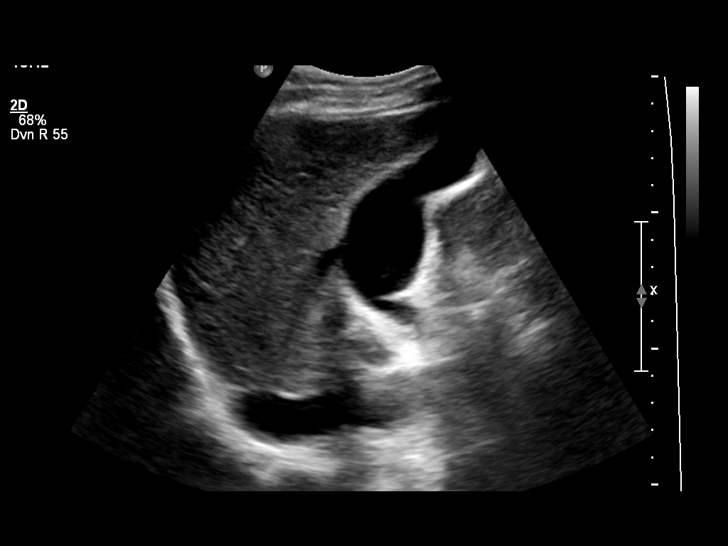
[im 60/66]
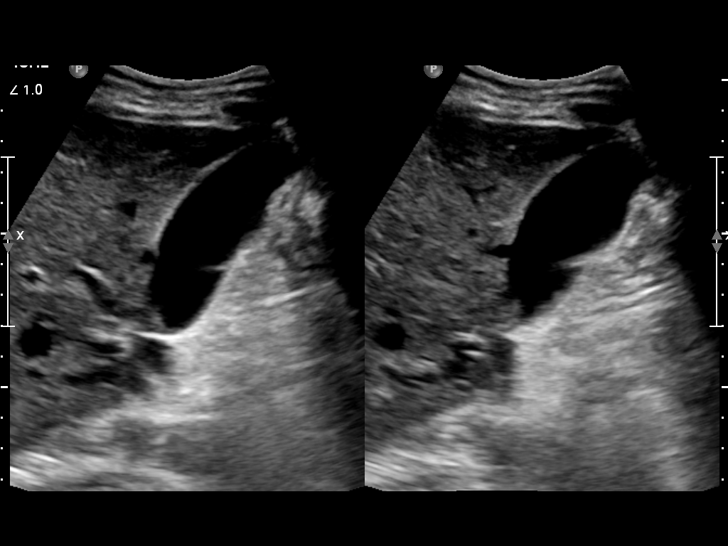
[im 66/66]
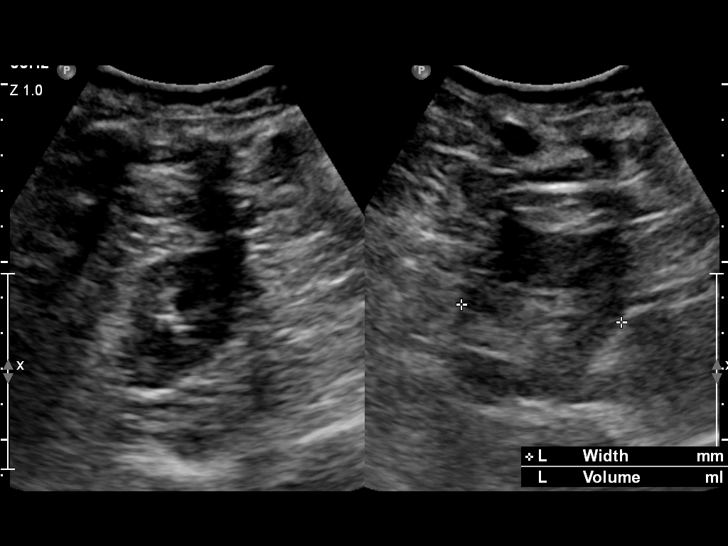

[14 of 25 positions shown; findings below may reference images not displayed]

FINDINGS: Liver is normal in echogenicity. Small septated left hepatic lobe cysts are again noted. There is no suspicious hepatic mass. There is no intra or extrahepatic biliary ductal dilation. Common bile duct measures 3 mm. No sludge or shadowing gallstones are seen. There is no gallbladder wall thickening or pericholecystic fluid. Pancreas is incompletely visualized due to artifact from overlying bowel gas. Spleen measures 9 cm and is unremarkable.

Kidneys are normal in echogenicity and measure 11.5 cm bilaterally. There is a 12 mm right kidney lower pole cyst.

Visualized abdominal aorta is without aneurysmal dilatation. IVC is normal. There is no ascites.
IMPRESSION: 1. Stable small hepatic cysts.

2. No evidence of cholelithiasis or acute cholecystitis.

3. Pancreas incompletely visualized due to artifact from overlying bowel gas.

## 2022-03-25 IMAGING — US US ABDOMEN COMPLETE
1 series · 14 of 25 positions shown · non-contrast
Comparison: Previous examination dated 02/07/2021.

﻿EXAM:  [id number]   US ABDOMEN COMPLETE
INDICATION: History of liver cyst.

[Series 1: us abdomen complete · 14 of 67 slices shown]
[im 1/67]
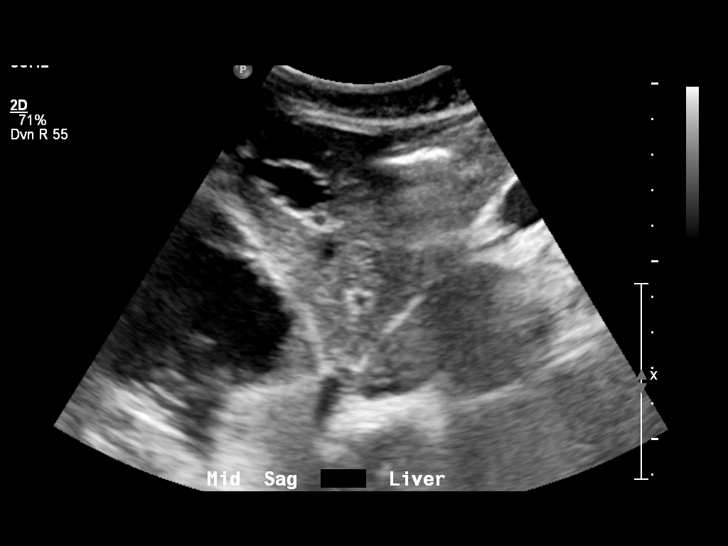
[im 6/67]
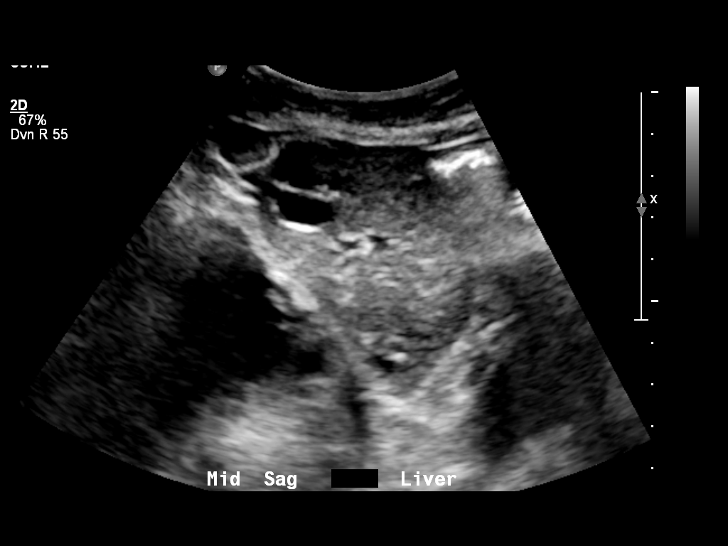
[im 12/67]
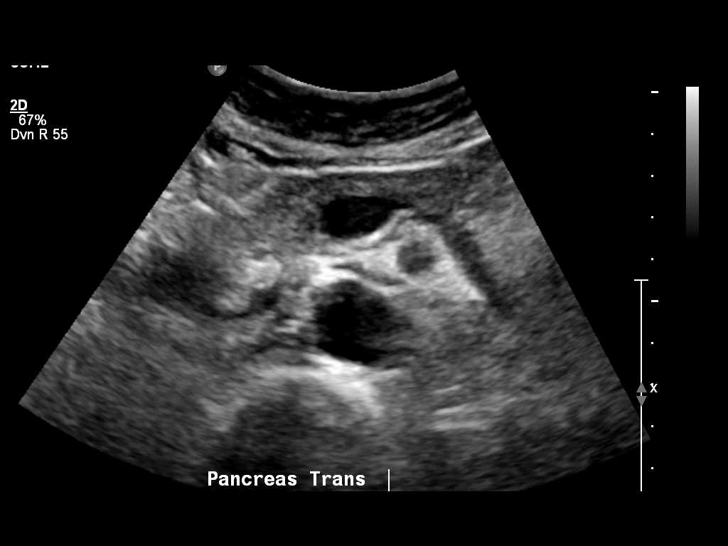
[im 17/67]
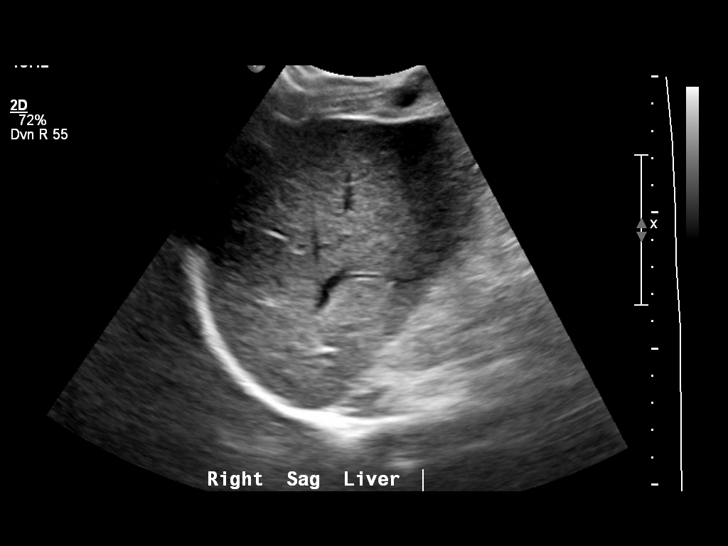
[im 23/67]
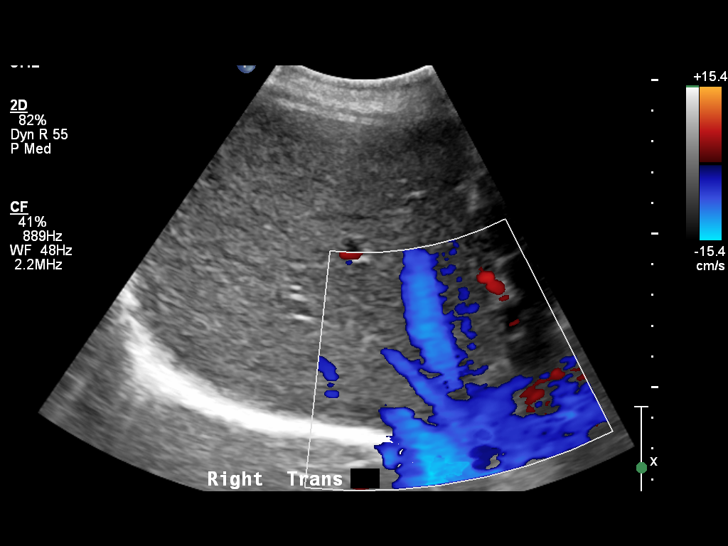
[im 25/67]
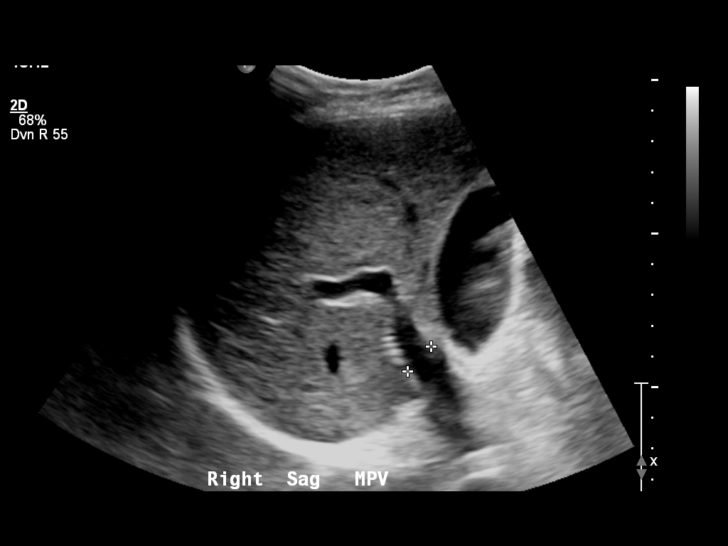
[im 31/67]
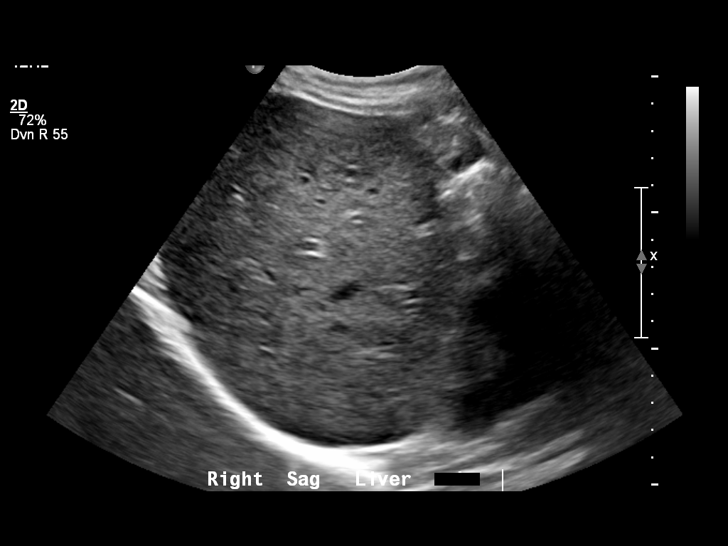
[im 36/67]
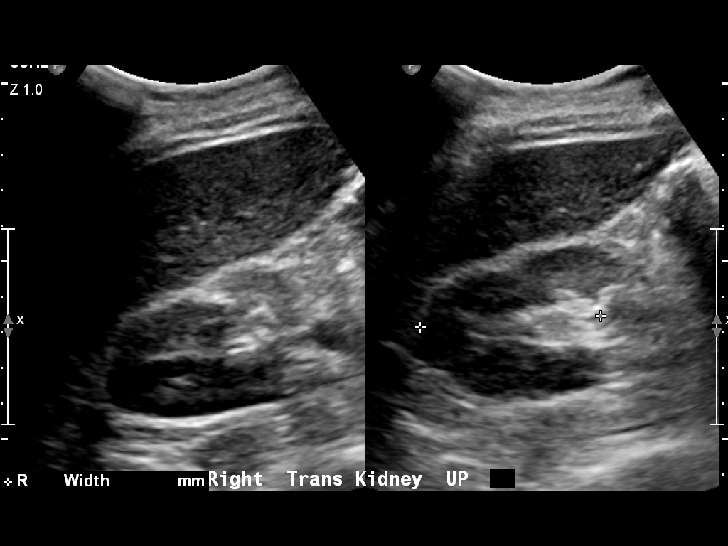
[im 42/67]
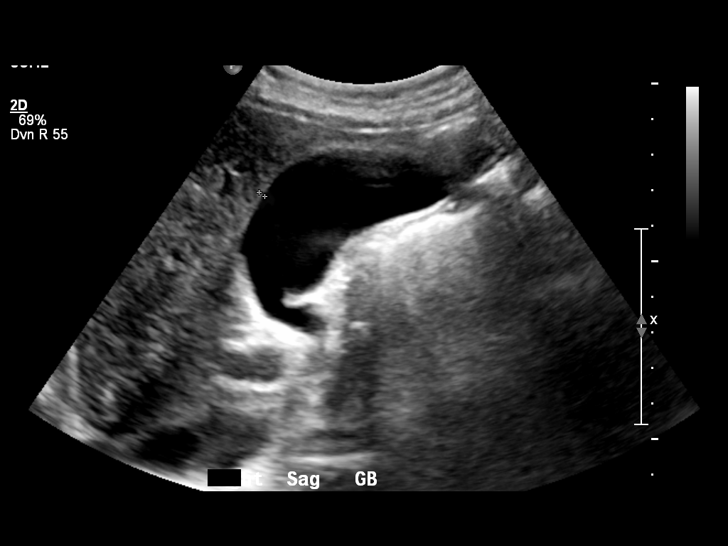
[im 45/67]
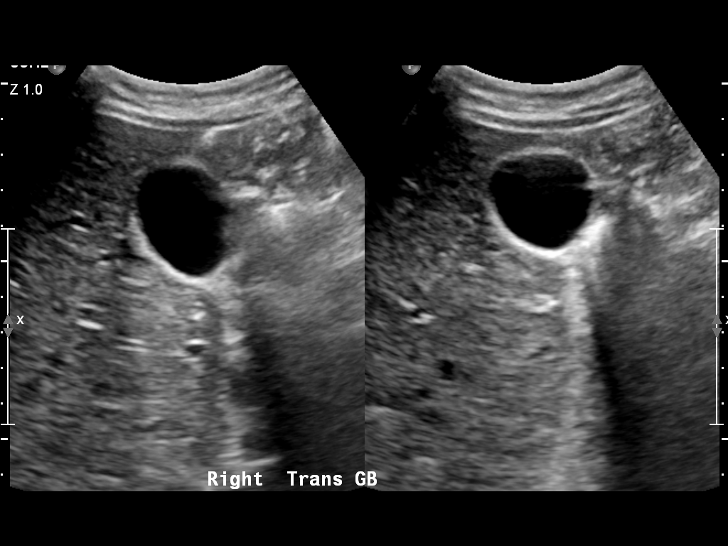
[im 50/67]
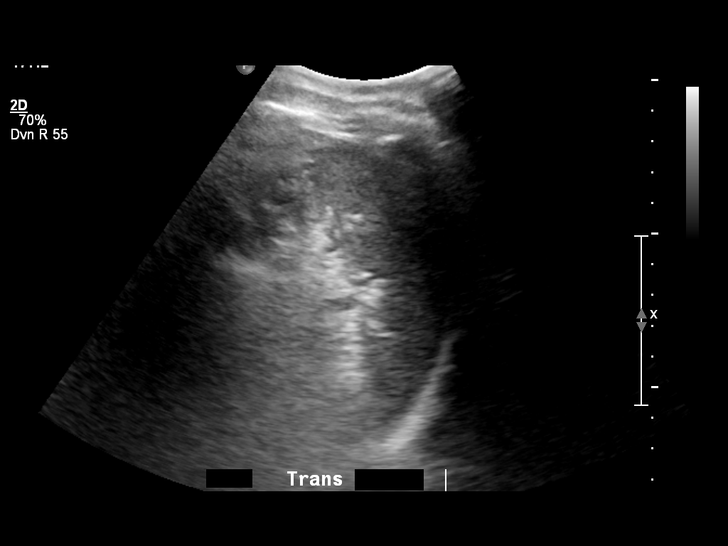
[im 56/67]
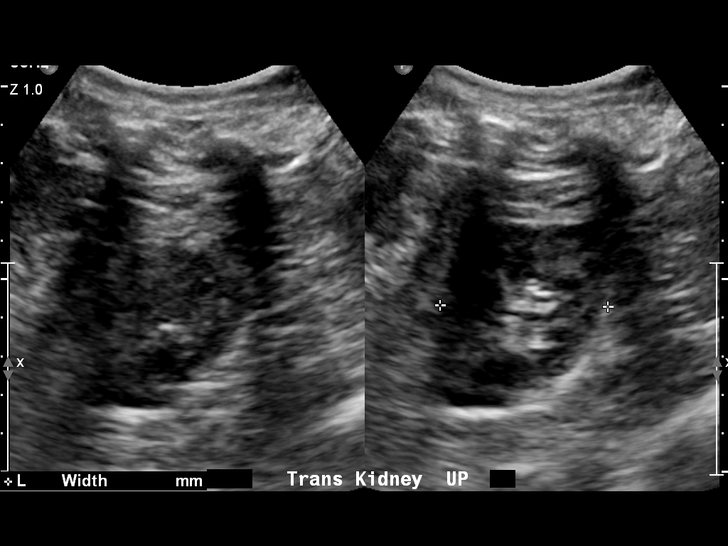
[im 61/67]
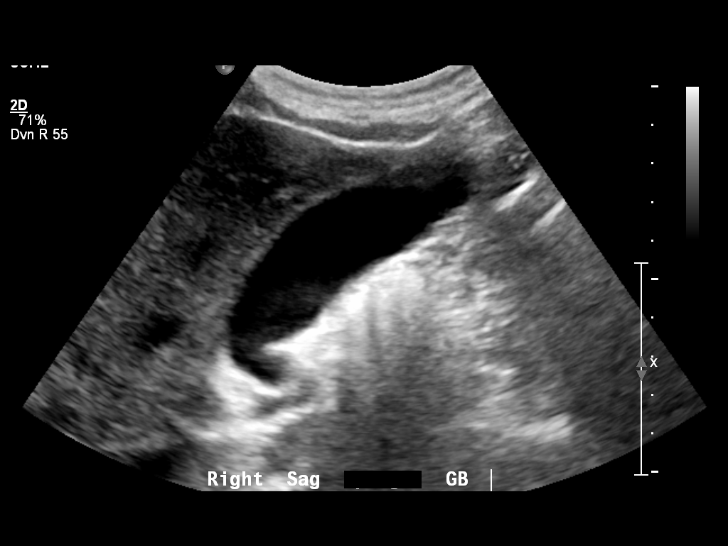
[im 67/67]
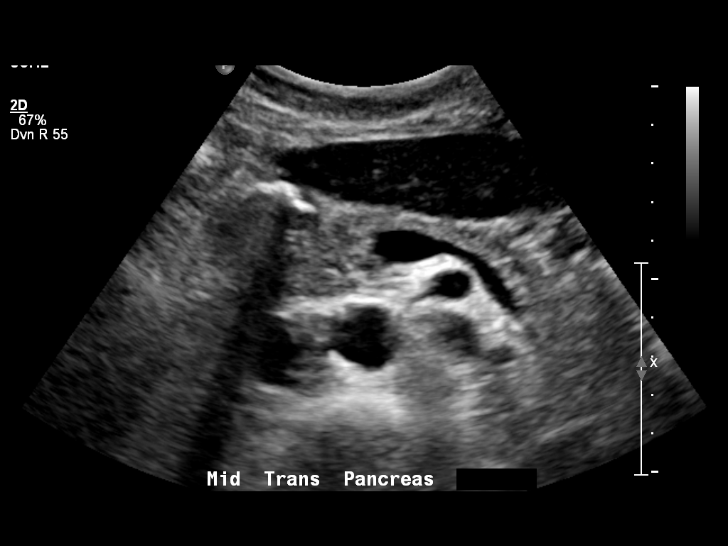

[14 of 25 positions shown; findings below may reference images not displayed]

FINDINGS: Gallbladder shows no stones or edema. Common hepatic duct measures 3 mm.

Mildly contracted liver 12.5 cm in length with mildly heterogeneous echotexture of liver.  Portal vein and hepatic veins are patent.  Stable, septated 2.8 cm cyst of the left lobe.

Normal size spleen measuring 9.7 cm in length.  Aorta, vena cava and partially visualized pancreas are normal to the extent visualized.  Kidneys are normal.  No free fluid.
IMPRESSION: 1. Normal gallbladder and extrahepatic bile ducts. 

2. Mildly contracted liver with mildly heterogeneous echotexture of the liver.  Please correlate with liver function.

3. Stable benign cyst of the liver.  No free fluid. Normal size spleen.

## 2023-02-11 ENCOUNTER — Other Ambulatory Visit: Payer: Self-pay

## 2023-02-11 ENCOUNTER — Encounter (HOSPITAL_COMMUNITY): Payer: Self-pay

## 2023-02-11 ENCOUNTER — Emergency Department
Admission: EM | Admit: 2023-02-11 | Discharge: 2023-02-11 | Disposition: A | Discharge status: Home / Self Care | Payer: Medicare Other | Attending: Physician Assistant | Admitting: Physician Assistant

## 2023-02-11 ENCOUNTER — Emergency Department (HOSPITAL_COMMUNITY): Payer: Medicare Other

## 2023-02-11 ENCOUNTER — Telehealth: Payer: Medicare Other | Admitting: Neurology

## 2023-02-11 DIAGNOSIS — M109 Gout, unspecified: Secondary | ICD-10-CM

## 2023-02-11 DIAGNOSIS — I69354 Hemiplegia and hemiparesis following cerebral infarction affecting left non-dominant side: Secondary | ICD-10-CM | POA: Insufficient documentation

## 2023-02-11 DIAGNOSIS — R208 Other disturbances of skin sensation: Secondary | ICD-10-CM

## 2023-02-11 DIAGNOSIS — Z8673 Personal history of transient ischemic attack (TIA), and cerebral infarction without residual deficits: Secondary | ICD-10-CM

## 2023-02-11 DIAGNOSIS — E871 Hypo-osmolality and hyponatremia: Secondary | ICD-10-CM | POA: Insufficient documentation

## 2023-02-11 DIAGNOSIS — R202 Paresthesia of skin: Secondary | ICD-10-CM | POA: Insufficient documentation

## 2023-02-11 DIAGNOSIS — F419 Anxiety disorder, unspecified: Secondary | ICD-10-CM | POA: Insufficient documentation

## 2023-02-11 DIAGNOSIS — R200 Anesthesia of skin: Secondary | ICD-10-CM

## 2023-02-11 DIAGNOSIS — I10 Essential (primary) hypertension: Secondary | ICD-10-CM

## 2023-02-11 DIAGNOSIS — R001 Bradycardia, unspecified: Secondary | ICD-10-CM | POA: Insufficient documentation

## 2023-02-11 HISTORY — DX: Essential (primary) hypertension: I10

## 2023-02-11 HISTORY — DX: Mixed hyperlipidemia: E78.2

## 2023-02-11 HISTORY — DX: Cerebral infarction, unspecified: I63.9

## 2023-02-11 LAB — COMPREHENSIVE METABOLIC PANEL, NON-FASTING
ALBUMIN/GLOBULIN RATIO: 1.6 — ABNORMAL HIGH (ref 0.8–1.4)
ALBUMIN: 4.6 g/dL (ref 3.5–5.7)
ALKALINE PHOSPHATASE: 89 U/L (ref 34–104)
ALT (SGPT): 19 U/L (ref 7–52)
ANION GAP: 11 mmol/L (ref 4–13)
AST (SGOT): 23 U/L (ref 13–39)
BILIRUBIN TOTAL: 0.9 mg/dL (ref 0.3–1.2)
BUN/CREA RATIO: 13 (ref 6–22)
BUN: 12 mg/dL (ref 7–25)
CALCIUM, CORRECTED: 9.3 mg/dL (ref 8.9–10.8)
CALCIUM: 9.8 mg/dL (ref 8.6–10.3)
CHLORIDE: 94 mmol/L — ABNORMAL LOW (ref 98–107)
CO2 TOTAL: 24 mmol/L (ref 21–31)
CREATININE: 0.92 mg/dL (ref 0.60–1.30)
ESTIMATED GFR: 90 mL/min/{1.73_m2} (ref >59)
GLOBULIN: 2.9 (ref 2.9–5.4)
GLUCOSE: 104 mg/dL (ref 74–109)
OSMOLALITY, CALCULATED: 259 mOsm/kg — ABNORMAL LOW (ref 270–290)
POTASSIUM: 4 mmol/L (ref 3.5–5.1)
PROTEIN TOTAL: 7.5 g/dL (ref 6.4–8.9)
SODIUM: 129 mmol/L — ABNORMAL LOW (ref 136–145)

## 2023-02-11 LAB — URINALYSIS, MACROSCOPIC
BILIRUBIN: NEGATIVE mg/dL
BLOOD: NEGATIVE mg/dL
GLUCOSE: NEGATIVE mg/dL
KETONES: 10 mg/dL — AB
LEUKOCYTES: NEGATIVE WBCs/uL
NITRITE: NEGATIVE
PH: 7 (ref 5.0–9.0)
PROTEIN: NEGATIVE mg/dL
SPECIFIC GRAVITY: 1.016 (ref 1.002–1.030)
UROBILINOGEN: NORMAL mg/dL

## 2023-02-11 LAB — URINALYSIS, MICROSCOPIC
RBCS: 3 /hpf (ref <4)
WBCS: <1 /hpf (ref <6)

## 2023-02-11 LAB — CBC WITH DIFF
BASOPHIL #: 0 10*3/uL (ref 0.00–0.10)
BASOPHIL %: 0 % (ref 0–1)
EOSINOPHIL #: 0 10*3/uL (ref 0.00–0.50)
EOSINOPHIL %: 0 % — ABNORMAL LOW
HCT: 45 % (ref 36.7–47.1)
HGB: 15.2 g/dL (ref 12.5–16.3)
LYMPHOCYTE #: 1.8 10*3/uL (ref 1.00–3.00)
LYMPHOCYTE %: 15 % — ABNORMAL LOW (ref 16–44)
MCH: 29.2 pg (ref 23.8–33.4)
MCHC: 33.8 g/dL (ref 32.5–36.3)
MCV: 86.2 fL (ref 73.0–96.2)
MONOCYTE #: 0.6 10*3/uL (ref 0.30–1.00)
MONOCYTE %: 5 % (ref 5–13)
MPV: 8.9 fL (ref 7.4–11.4)
NEUTROPHIL #: 9.6 10*3/uL — ABNORMAL HIGH (ref 1.85–7.80)
NEUTROPHIL %: 79 % — ABNORMAL HIGH (ref 43–77)
PLATELETS: 244 10*3/uL (ref 140–440)
RBC: 5.22 10*6/uL (ref 4.06–5.63)
RDW: 13.9 % (ref 12.1–16.2)
WBC: 12.1 10*3/uL — ABNORMAL HIGH (ref 3.6–10.2)

## 2023-02-11 LAB — TROPONIN-I
TROPONIN I: 5 ng/L (ref <20)
TROPONIN I: 4 ng/L (ref <20)
TROPONIN I: 4 ng/L (ref <20)

## 2023-02-11 LAB — PT/INR
INR: 1.02 (ref 0.84–1.10)
PROTHROMBIN TIME: 11.9 seconds (ref 9.8–12.7)

## 2023-02-11 LAB — GOLD TOP TUBE

## 2023-02-11 LAB — PTT (PARTIAL THROMBOPLASTIN TIME): APTT: 33.7 seconds (ref 25.0–38.0)

## 2023-02-11 LAB — GRAY TOP TUBE

## 2023-02-11 LAB — MAGNESIUM: MAGNESIUM: 1.8 mg/dL — ABNORMAL LOW (ref 1.9–2.7)

## 2023-02-11 MED ORDER — CLONIDINE HCL 0.1 MG TABLET
ORAL_TABLET | ORAL | Status: AC
Start: 2023-02-11 — End: 2023-02-11
  Filled 2023-02-11: qty 1

## 2023-02-11 MED ORDER — IOHEXOL 350 MG IODINE/ML INTRAVENOUS SOLUTION
100.0000 mL | INTRAVENOUS | Status: AC
Start: 2023-02-11 — End: 2023-02-11
  Administered 2023-02-11: 100 mL via INTRAVENOUS

## 2023-02-11 MED ORDER — SODIUM CHLORIDE 0.9 % INTRAVENOUS SOLUTION
INTRAVENOUS | Status: DC
Start: 2023-02-11 — End: 2023-02-11
  Administered 2023-02-11: 0 mL via INTRAVENOUS

## 2023-02-11 MED ORDER — CLONIDINE HCL 0.1 MG TABLET
0.1000 mg | ORAL_TABLET | ORAL | Status: AC
Start: 2023-02-11 — End: 2023-02-11
  Administered 2023-02-11: 0.1 mg via ORAL

## 2023-02-11 NOTE — ED Provider Notes (Signed)
Emergency Medicine      Name: Mitchell Wright  Age and Gender: 72 y.o. male  Date of Birth: [birth year]  MRN: [id number]  PCP: No Pcp    CC:  Chief Complaint   Patient presents with    Facial Numbness/tingling    Abnormal EKG       HPI:  Mitchell Wright is a 72 y.o. White male who presents to the ER with intermittent cramping of the occipital scalp, numbness of his face and lower extremities, dizziness and nausea for the past 2 weeks. Patient states the symptoms do not necessarily occur at the same time. He denies chest pain or shortness of breath. He has a PMH of CVA in the past causing unsteady gait and left sided weakness. He says he may have some slight left sided weakness. He denies speech changes. He went to his PCP's office today for these symptoms and was referred to the ER for further evaluation and treatment.    Below pertinent information reviewed with patient:  Past Medical History:   Diagnosis Date    CVA (cerebrovascular accident) (CMS HCC)     HTN (hypertension)     Mixed hyperlipidemia            No Known Allergies    Past Surgical History:   Procedure Laterality Date    HX HERNIA REPAIR          Social History     Socioeconomic History    Marital status: Married   Tobacco Use    Smoking status: Never    Smokeless tobacco: Never   Substance and Sexual Activity    Alcohol use: Never    Drug use: Never       ROS:  No other overt positive review of systems are noted other than stated in the HPI.      Objective:    ED Triage Vitals [02/11/23 1120]   BP (Non-Invasive) (!) 176/99   Heart Rate 57   Respiratory Rate 18   Temperature 36.3 C (97.3 F)   SpO2 98 %   Weight 71.2 kg (157 lb)   Height 1.803 m (5\' 11" )     Filed Vitals:    02/11/23 1330 02/11/23 1430 02/11/23 1500 02/11/23 1515   BP: (!) 190/93 (!) 174/90 (!) 174/90 (!) 177/99   Pulse:  55  61   Resp:  (!) 35     Temp:       SpO2:  99%  98%       Nursing notes and vital signs reviewed.    Constitutional - No acute distress.  Alert and  Active.  HEENT - Normocephalic. PERRL. EOMI. Conjunctiva clear. Oropharynx with no erythema, lesions, or exudates. Moist mucous membranes.   Neck - Trachea midline. No stridor. No hoarseness.  Cardiac - Regular rate and rhythm. No murmurs, rubs, or gallops. Intact distal pulses.  Respiratory/Chest - Normal respiratory effort. Clear to auscultation bilaterally. No rales, wheezes or rhonchi.  Abdomen - Normal bowel sounds. Non-tender, soft, non-distended. No rebound or guarding.   Musculoskeletal - Good AROM.  No clubbing, cyanosis or edema.  Skin - Warm and dry, without any rashes or other lesions.  Neuro - Alert and oriented x 3. Cranial nerves II-XII are grossly intact.  Moving all extremities symmetrically. Patient endorses intact sensation throughout, with possibly slightly diminished sensation of the left upper arm.  Psych - Normal mood and affect. Behavior is normal       Any pertinent  labs and imaging obtained during this encounter reviewed below in MDM.    MDM/ED Course:      Medical Decision Making  Patient presents to the ER with intermittent cramping of the occipital scalp, numbness of his face and lower extremities, dizziness and nausea for the past 2 weeks. Patient states the symptoms do not necessarily occur at the same time. He denies chest pain or shortness of breath. He has a PMH of CVA in the past causing unsteady gait and left sided weakness. He says he may have some slight left sided weakness. He denies speech changes. He went to his PCP's office today for these symptoms and was referred to the ER for further evaluation and treatment. Differential diagnoses include electrolyte abnormality, atypical headache, neurologic event.  Patient underwent diagnostics with results as noted in ED course.  He was found to have mild hyponatremia.  Tele neuro was consulted who recommended CTA of the head and neck and if normal, stated patient could have an outpatient neurology workup.  If admitted, recommended  MRI brain.  Hospitalist was consulted who did present to the ER to evaluate the patient and felt he could be discharged patient will be discharged home in stable condition with instructions to see his PCP for an outpatient BMP in 1 week and referral to Neurology.    Amount and/or Complexity of Data Reviewed  Labs: ordered. Decision-making details documented in ED Course.  Radiology: ordered. Decision-making details documented in ED Course.  ECG/medicine tests: ordered and independent interpretation performed.    Risk  Prescription drug management.              ED Course as of 02/11/23 1637   Wed Feb 11, 2023   1314 CT BRAIN WO IV CONTRAST  No acute intracranial bleed, ischemia, or hydrocephalus  Prominent CSF space at the posterior fossa as detailed above. 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 If there are no old outside exams available or reports available for comparison to document stability over time, follow-up with the MRI the brain with and without contrast could BE considered for future workup   1350 Dr Francisco Capuchin, teleneuro, states prominent CSF is a normal variant, recommends CTA head/neck in the ED and if normal, states patient should have outpatient neurology workup   1504 WBC(!): 12.1   1504 INR: 1.02   1504 PROTHROMBIN TIME: 11.9   1504 aPTT: 33.7   1504 KETONES(!): 10   1504 SODIUM(!): 129   1504 MAGNESIUM(!): 1.8   1504 TROPONIN-I: 4  Similar to initial troponins   1519 CT ANGIO INTRA-EXTRA CRANIAL W IV CONTRAST  1.         RIGHT CAROTID: NO HEMODYNAMICALLY SIGNIFICANT STENOSIS FOR THE ICA  2.         LEFT CAROTID: NO HEMODYNAMICALLY SIGNIFICANT STENOSIS FOR THE ICA  3.         VERTEBRALS: WITHIN NORMAL LIMITS  4.         INTRACRANIAL: NO LARGE VESSEL HIGH-GRADE STENOSIS OR OCCLUSION   1610 Patient evaluated by Dr Ebbie Ridge who feels patient can be discharged to follow up with Neurology       Orders Placed This Encounter    CT BRAIN WO IV CONTRAST    CT ANGIO INTRA-EXTRA  CRANIAL W IV CONTRAST    CBC/DIFF    COMPREHENSIVE METABOLIC PANEL, NON-FASTING    TROPONIN NOW    TROPONIN IN ONE HOUR    TROPONIN IN THREE HOURS  PT/INR    PTT (PARTIAL THROMBOPLASTIN TIME)    MAGNESIUM    CBC WITH DIFF    URINALYSIS, MACROSCOPIC AND MICROSCOPIC W/CULTURE REFLEX    URINALYSIS, MACROSCOPIC    URINALYSIS, MICROSCOPIC    EXTRA TUBES    GOLD TOP TUBE    GRAY TOP TUBE    ECG 12 LEAD    NS premix infusion    cloNIDine (CATAPRES) tablet    iohexol (OMNIPAQUE 350) infusion           Impression:   Clinical Impression   Hyponatremia (Primary)   Paresthesia       Disposition: Discharged    / Maryagnes Amos PA-C  02/11/2023, 13:15  [facility]  Department of Emergency Medicine  [facility]    Portions of this note may have been dictated using voice recognition software.     -----------------------  Results for orders placed or performed during the hospital encounter of 02/11/23 (from the past 12 hour(s))   COMPREHENSIVE METABOLIC PANEL, NON-FASTING   Result Value Ref Range    SODIUM 129 (L) 136 - 145 mmol/L    POTASSIUM 4.0 3.5 - 5.1 mmol/L    CHLORIDE 94 (L) 98 - 107 mmol/L    CO2 TOTAL 24 21 - 31 mmol/L    ANION GAP 11 4 - 13 mmol/L    BUN 12 7 - 25 mg/dL    CREATININE 1.61 0.96 - 1.30 mg/dL    BUN/CREA RATIO 13 6 - 22    ESTIMATED GFR 90 >59 mL/min/1.60m^2    ALBUMIN 4.6 3.5 - 5.7 g/dL    CALCIUM 9.8 8.6 - 04.5 mg/dL    GLUCOSE 409 74 - 811 mg/dL    ALKALINE PHOSPHATASE 89 34 - 104 U/L    ALT (SGPT) 19 7 - 52 U/L    AST (SGOT) 23 13 - 39 U/L    BILIRUBIN TOTAL 0.9 0.3 - 1.2 mg/dL    PROTEIN TOTAL 7.5 6.4 - 8.9 g/dL    ALBUMIN/GLOBULIN RATIO 1.6 (H) 0.8 - 1.4    OSMOLALITY, CALCULATED 259 (L) 270 - 290 mOsm/kg    CALCIUM, CORRECTED 9.3 8.9 - 10.8 mg/dL    GLOBULIN 2.9 2.9 - 5.4   TROPONIN NOW   Result Value Ref Range    TROPONIN I 5 <20 ng/L   PT/INR   Result Value Ref Range    PROTHROMBIN TIME 11.9 9.8 - 12.7 seconds    INR 1.02 0.84 - 1.10   PTT (PARTIAL THROMBOPLASTIN TIME)    Result Value Ref Range    APTT 33.7 25.0 - 38.0 seconds   MAGNESIUM   Result Value Ref Range    MAGNESIUM 1.8 (L) 1.9 - 2.7 mg/dL   CBC WITH DIFF   Result Value Ref Range    WBC 12.1 (H) 3.6 - 10.2 x10^3/uL    RBC 5.22 4.06 - 5.63 x10^6/uL    HGB 15.2 12.5 - 16.3 g/dL    HCT 91.4 78.2 - 95.6 %    MCV 86.2 73.0 - 96.2 fL    MCH 29.2 23.8 - 33.4 pg    MCHC 33.8 32.5 - 36.3 g/dL    RDW 21.3 08.6 - 57.8 %    PLATELETS 244 140 - 440 x10^3/uL    MPV 8.9 7.4 - 11.4 fL    NEUTROPHIL % 79 (H) 43 - 77 %    LYMPHOCYTE % 15 (L) 16 - 44 %    MONOCYTE % 5 5 -  13 %    EOSINOPHIL % 0 (L) %    BASOPHIL % 0 0 - 1 %    NEUTROPHIL # 9.60 (H) 1.85 - 7.80 x10^3/uL    LYMPHOCYTE # 1.80 1.00 - 3.00 x10^3/uL    MONOCYTE # 0.60 0.30 - 1.00 x10^3/uL    EOSINOPHIL # 0.00 0.00 - 0.50 x10^3/uL    BASOPHIL # 0.00 0.00 - 0.10 x10^3/uL   URINALYSIS, MACROSCOPIC   Result Value Ref Range    COLOR Light Yellow Colorless, Light Yellow, Yellow    APPEARANCE Clear Clear    SPECIFIC GRAVITY 1.016 1.002 - 1.030    PH 7.0 5.0 - 9.0    LEUKOCYTES Negative Negative, 100  WBCs/uL    NITRITE Negative Negative    PROTEIN Negative Negative, 10 , 20  mg/dL    GLUCOSE Negative Negative, 30  mg/dL    KETONES 10 (A) Negative, Trace mg/dL    BILIRUBIN Negative Negative, 0.5 mg/dL    BLOOD Negative Negative, 0.03 mg/dL    UROBILINOGEN Normal Normal mg/dL   URINALYSIS, MICROSCOPIC   Result Value Ref Range    RBCS 3 <4 /hpf    WBCS <1 <6 /hpf   GRAY TOP TUBE   Result Value Ref Range    RAINBOW/EXTRA TUBE AUTO RESULT Yes    TROPONIN IN ONE HOUR   Result Value Ref Range    TROPONIN I 4 <20 ng/L   TROPONIN IN THREE HOURS   Result Value Ref Range    TROPONIN I 4 <20 ng/L     CT ANGIO INTRA-EXTRA CRANIAL W IV CONTRAST   Final Result   1. RIGHT CAROTID: NO HEMODYNAMICALLY SIGNIFICANT STENOSIS FOR THE ICA   2. LEFT CAROTID: NO HEMODYNAMICALLY SIGNIFICANT STENOSIS FOR THE ICA   3. VERTEBRALS: WITHIN NORMAL LIMITS   4. INTRACRANIAL: NO LARGE VESSEL HIGH-GRADE STENOSIS OR  OCCLUSION   5.          One or more dose reduction techniques were used (e.g., Automated exposure control, adjustment of the mA and/or kV according to patient size, use of iterative reconstruction technique).         Radiologist location ID: [id number]         CT BRAIN WO IV CONTRAST   Final Result   No acute intracranial bleed, ischemia, or hydrocephalus   Prominent CSF space at the posterior fossa as detailed above. 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 If there are no old outside exams available or reports available for comparison to document stability over time, follow-up with the MRI the brain with and without contrast could BE considered for future workup         One or more dose reduction techniques were used (e.g., Automated exposure control, adjustment of the mA and/or kV according to patient size, use of iterative reconstruction technique).         Radiologist location ID: [id number]

## 2023-02-11 NOTE — ED Triage Notes (Signed)
Pt reports he was sent by PCP for abnormal EKG. Pt reports numbness and cramping in the back of head, face, and legs. Pt reports dizziness and nausea intermittently.

## 2023-02-11 NOTE — Consults (Signed)
Griffey, [place], 72 y.o. male  MRN:  [id number]     Date of Admission:  (Not on file)  Date of Birth:  [birth year]  Date of Service:  02/11/2023    Patient/Family consent for econsult- Yes  Location of Consultation:  [place]  Consult Requested By Orson Slick PA  Time Spent: 15    Reason for consult: numbness    Intermittent two week history of numbness of face bilaterally, scalp cramping, lower extremity cramping. These symptoms come and go. Went in to see his PCP who sent in to ER for evaluation due to abnormal EKG.       Recommendations:   -Low likelihood of stroke  -Get CTA in ED, if unremarkable, can pursue outpatient work up  -CT head reviewed, large cisterna magna present which does not explain his symptoms         Huel Cote, MD  02/11/2023, 13:54

## 2023-02-11 NOTE — Consults (Signed)
We were asked to come consult and see patient for potential admission to the hospital.  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  Patient states that his occipital scalp felt like it would tighten up and he would have burning sensation that will go away within a few seconds.  He also stated that at times he would have sensation of having some numbness and tingling on both sides of his face that may not always happen at the same time of the scalp contraction.  He also endorsed leg numbness and tingling at times as well.  CT of the head without and CT angiogram negative for any acute process.  Was evaluated by tele Neurology who felt that he can follow up outpatient basis with potential MRI in the near future and follow-up with neurology.  He has no focal deficits on physical exam today.  Sodium levels were mildly low at 129 as he states he had been told to reduce his salt intake.  I instructed him that he would need a repeat BMP here at the hospital within a week.  Also needs close follow-up with Neurology.  Patient counseled that if his symptoms significantly worsen or if they become one-sided/focal he needs to come back to emergency department.    Physical exam:  BP (!) 177/99   Pulse 61   Temp 36.3 C (97.3 F)   Resp (!) 35   Ht 1.803 m (5\' 11" )   Wt 71.2 kg (157 lb)   SpO2 98%   BMI 21.90 kg/m       Gen: This is a 72 y.o. Year-old male  who is awake alert and oriented x3 in no acute distress  CV:  Regular rate and rhythm without murmurs rubs or gallops.  2+ pedal and radial pulses. No clubbing, cyanosis, or edema.  RESP:  Clear to auscultation bilaterally without wheezes rales or rhonchi.  Respirations are nonlabored.  GI:  Abdomen is soft, nontender, nondistended.  Bowel sounds normoactive.  GU:  No foley is present  MSK:  Full range of motion of upper and lower extremities  NEURO:  Cranial nerves 2-12 grossly intact.   No focal deficits as tested.  SKIN: Warm, dry, intact, without lesions, rashes, or ulcerations      Ebbie Ridge, DO

## 2023-02-11 NOTE — ED Nurses Note (Signed)
REC TO ED 18 AMBULATORY FROM TRIAGE W/C/O NUMBNESS AND TINGLING BACK OF His HEAD, He SAYS He WAS AT His PCP AND THEY DID AN EKG WAS TOLD THAT He HAD HAD MI IN THE PAST SO SENT HIM HERE, He DENIES CHEST PAIN OR ANY CARDIAC HISTORY. NO NEURO DEFICITS NOTED, SR-SB ON MONITOR, NO ECTOPY NOTED, FAMILY AT BS, CALL BELL PLACED AT BS

## 2023-02-11 NOTE — Discharge Instructions (Addendum)
Follow up with Dr Sedalia Muta, Neurology, or the Neurologist of your choice for further evaluation and treatment. See your PCP tomorrow to discuss further evaluation with outpatient MRI and blood pressure control, as your hydrochlorothiazide may be the cause of your low sodium. The admitting physician recommended that you have a repeat BMP in 1 week. Return to the ER for other emergencies or as needed

## 2023-02-11 NOTE — ED Nurses Note (Signed)
PT DCD HOME, PT INSTRUCTED TO FOLLOW UP W/NEUROLOGIST AND PHONE NUMBER PROVIDED, ALSO INSTRUCTED TO FOLLOW UP W/PCP AND HAVE REPEAT BMP DRAWN IN 1 WEEK, PT STATES UNDERSTANDING, PT AMBULATED OUT W/FAMILY

## 2023-02-12 DIAGNOSIS — R001 Bradycardia, unspecified: Secondary | ICD-10-CM

## 2023-02-12 DIAGNOSIS — R9431 Abnormal electrocardiogram [ECG] [EKG]: Secondary | ICD-10-CM

## 2023-02-12 LAB — ECG 12 LEAD
Atrial Rate: 58 {beats}/min
Calculated P Axis: 63 degrees
Calculated R Axis: 71 degrees
Calculated T Axis: 73 degrees
PR Interval: 186 ms
QRS Duration: 108 ms
QT Interval: 426 ms
QTC Calculation: 418 ms
Ventricular rate: 58 {beats}/min

## 2023-03-30 IMAGING — US US ABDOMEN COMPLETE
1 series · 14 of 25 positions shown · non-contrast
Comparison: Ultrasound dated 03/25/2022.

﻿EXAM: US ABDOMEN COMPLETE
INDICATION: History of liver cyst.

[Series 1: us abdomen complete · 14 of 62 slices shown]
[im 1/62]
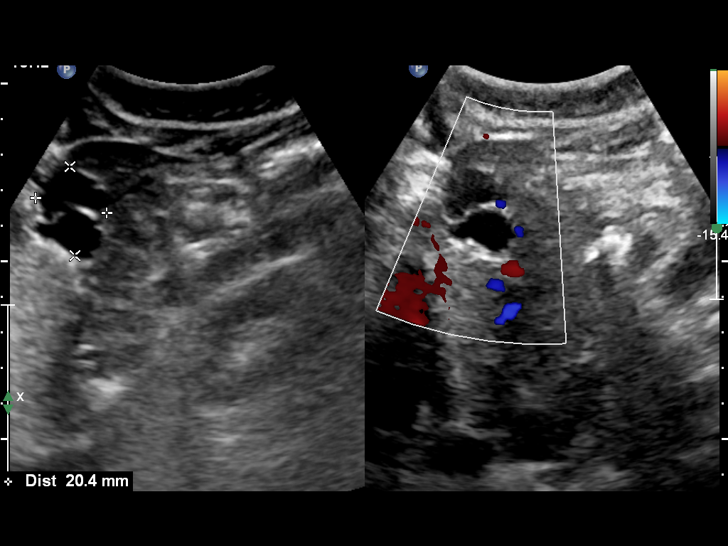
[im 6/62]
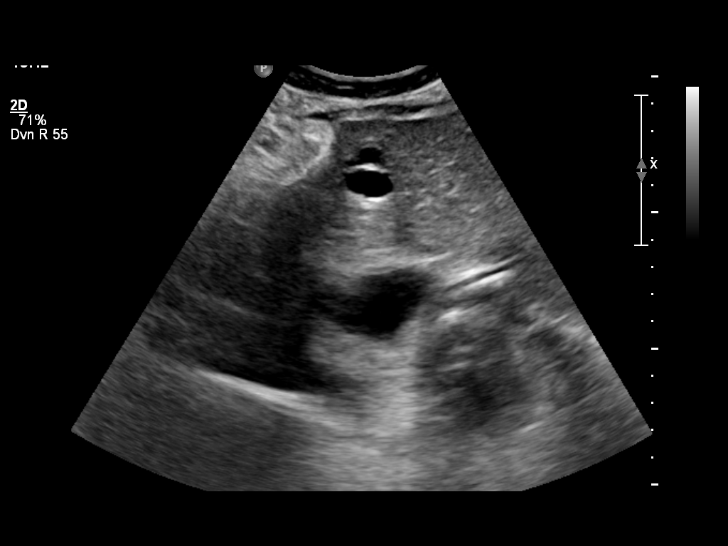
[im 11/62]
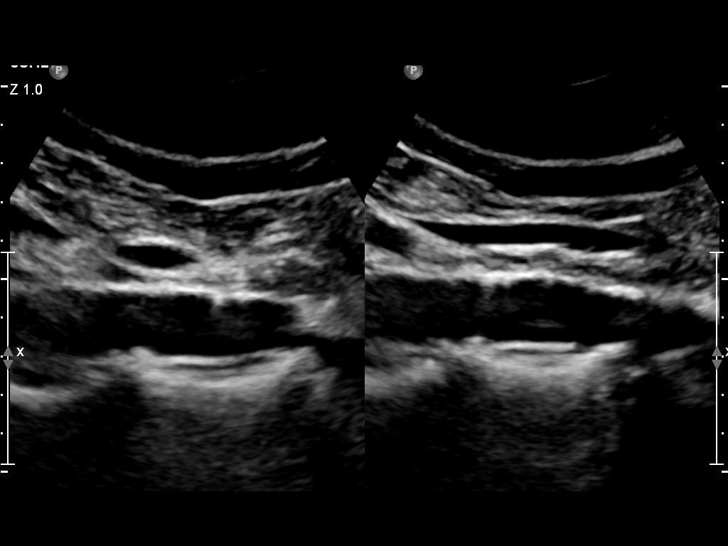
[im 16/62]
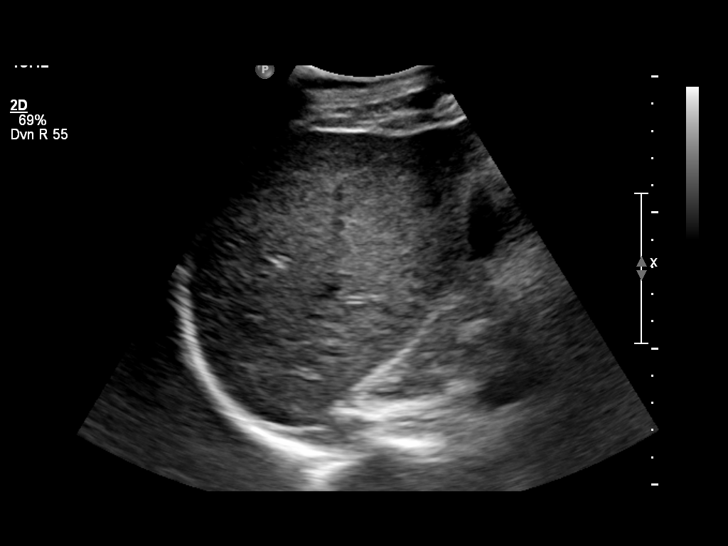
[im 21/62]
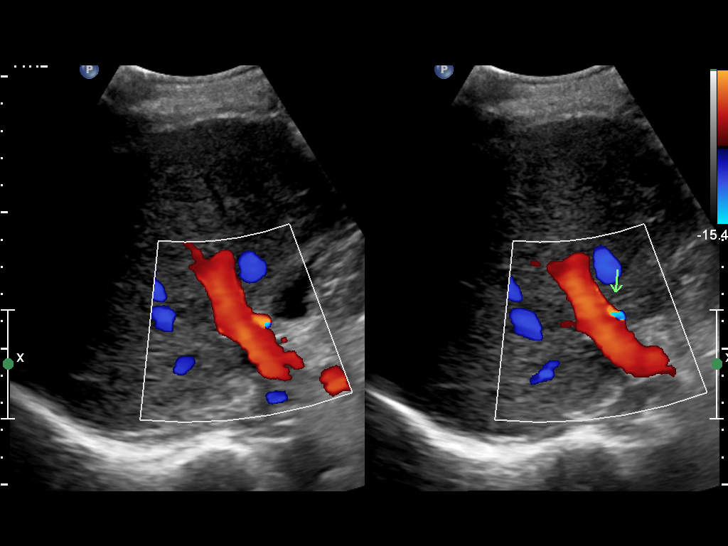
[im 23/62]
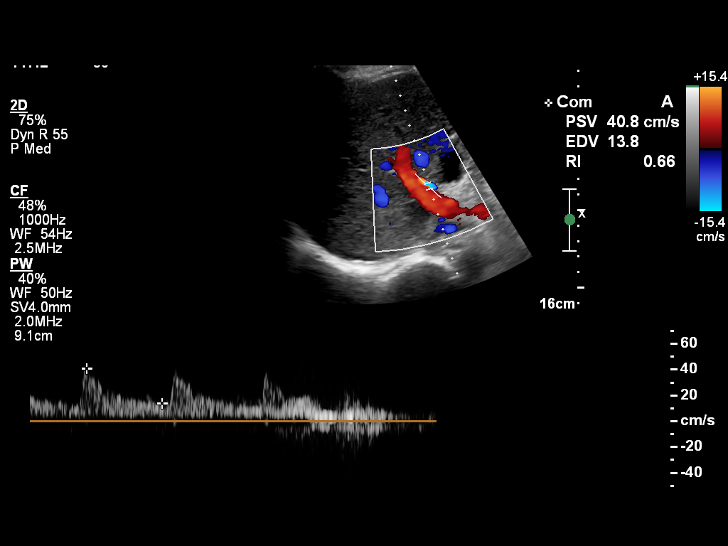
[im 28/62]
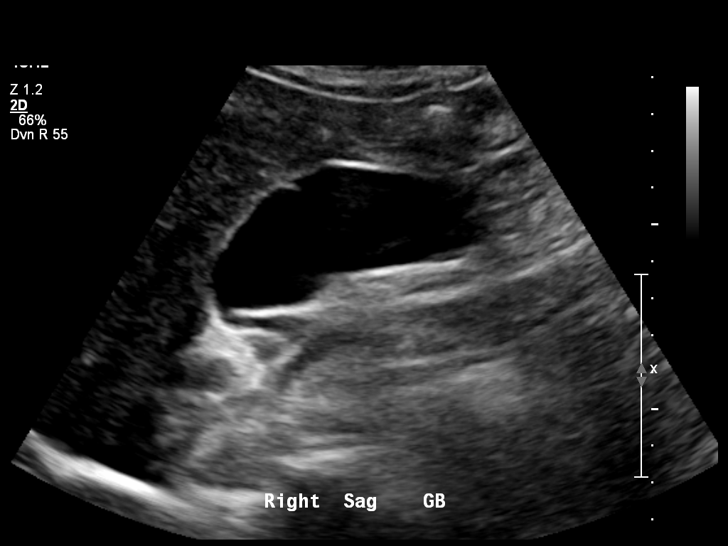
[im 34/62]
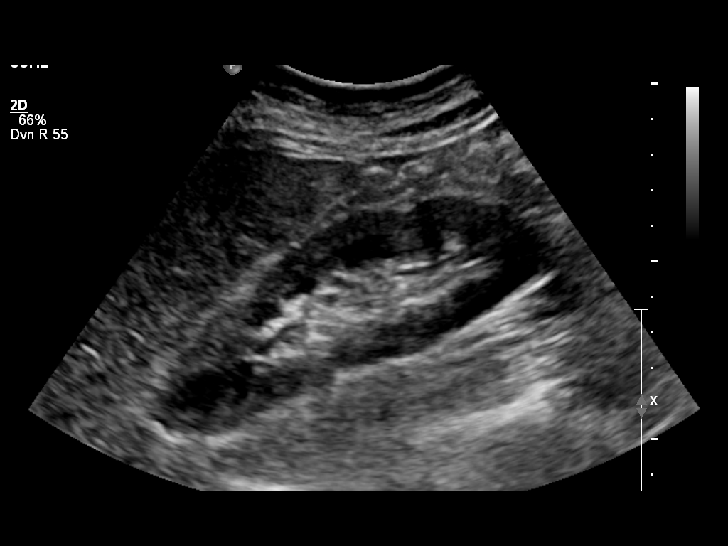
[im 39/62]
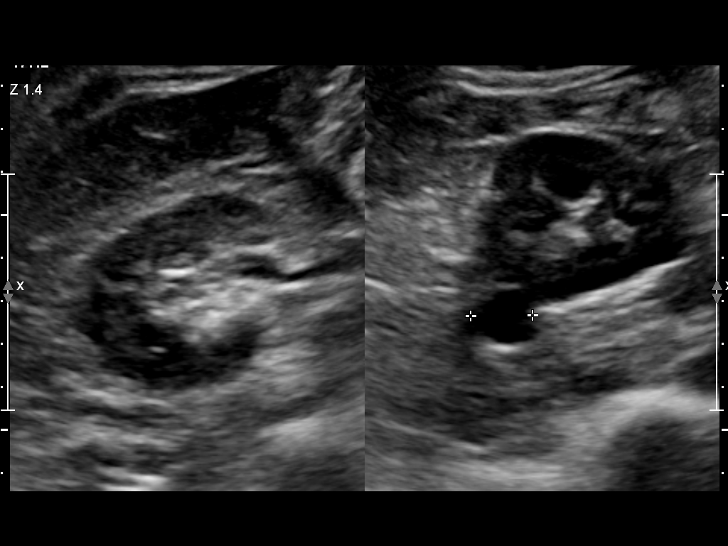
[im 41/62]
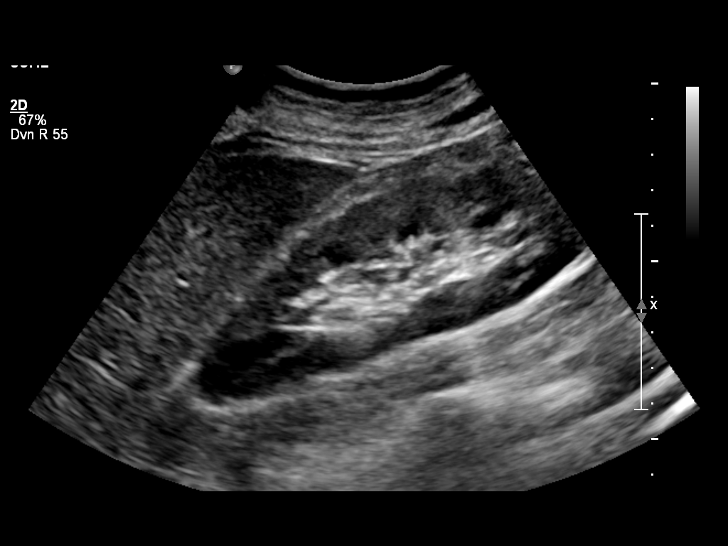
[im 46/62]
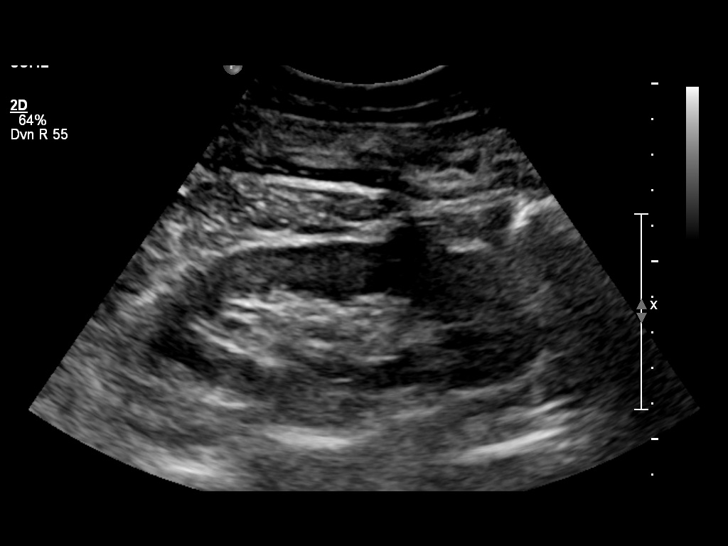
[im 51/62]
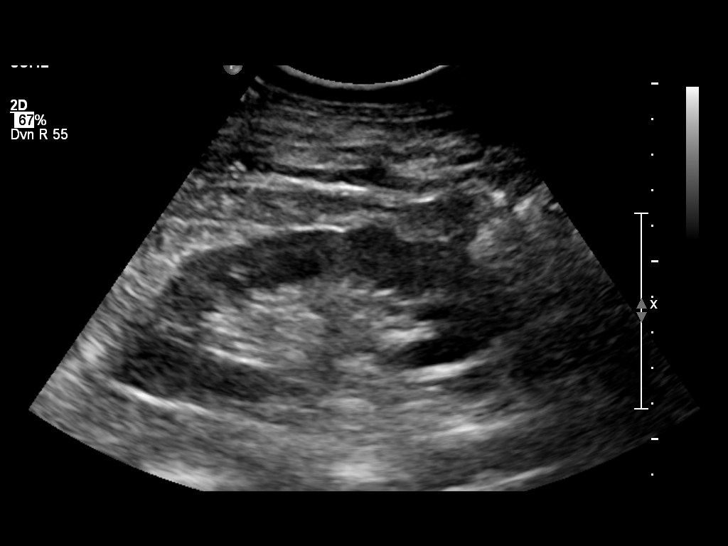
[im 56/62]
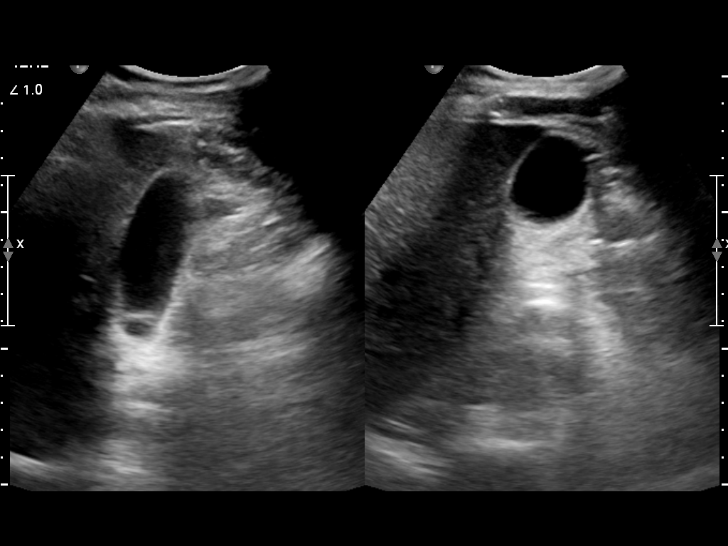
[im 62/62]
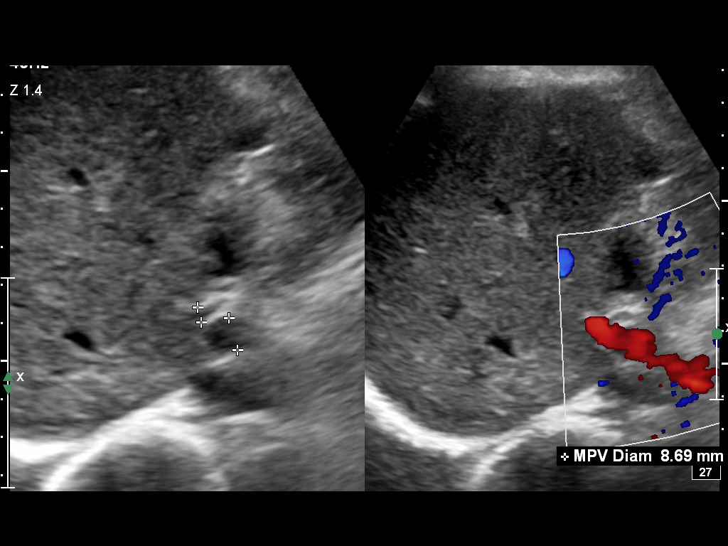

[14 of 25 positions shown; findings below may reference images not displayed]

FINDINGS: No evidence of gallstones. Extrahepatic bile ducts are normal in size.

Mildly contracted liver with heterogeneous echotexture similar to previous examination probably representing chronic liver disease.  Stable septated cystic lesion 2.5 cm in size.  Normal size spleen. Aorta, vena cava and kidneys are unremarkable. Poorly visualized pancreas. No free fluid.
IMPRESSION: 1. Normal gallbladder and extrahepatic bile ducts.

2. Mildly contracted liver with heterogeneous echotexture. Stable cystic lesion of the liver.  Normal size spleen.  No free fluid.

3. Pancreas incompletely visualized due to artifact from overlying bowel gas.

## 2023-07-28 IMAGING — MR MRI LUMBAR SPINE WITHOUT CONTRAST
4 of 6 series · 31 of 48 positions shown · IV contrast (gadolinium)
Comparison: Lumbar spine x-ray dated 08/27/2015.

﻿EXAM:  [id number]   MRI LUMBAR SPINE WITHOUT CONTRAST
INDICATION: 72-year-old with persistent low back pain and radiculopathy to both legs.  No history of back surgery or malignancy.
TECHNIQUE: Multiplanar, multisequential MRI of the lumbosacral spine was performed without gadolinium contrast.

[Series 5: T2 · sagittal · 4.0mm · 0.94mm/px · 6 of 13 slices shown (1 of 3)]
[im 1/13]
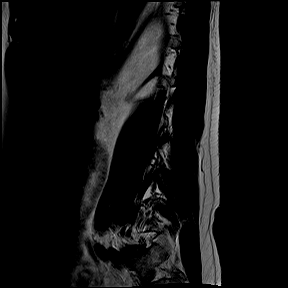
[im 3/13]
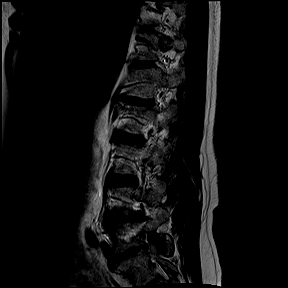
[im 5/13]
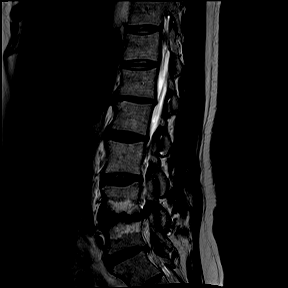
[im 8/13]
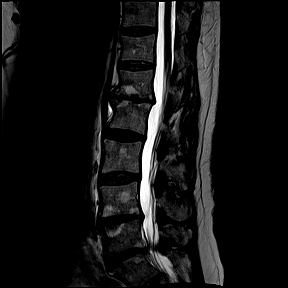
[im 10/13]
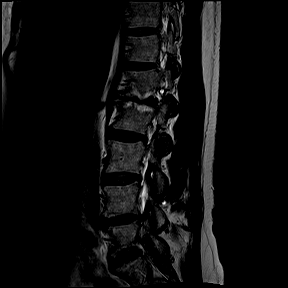
[im 13/13]
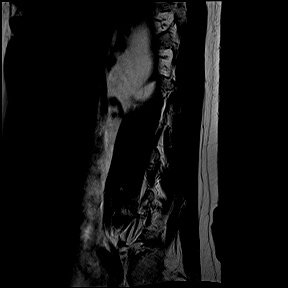

[Series 6: T1 · sagittal · 4.0mm · 0.94mm/px · 6 of 13 slices shown]
[im 1/13]
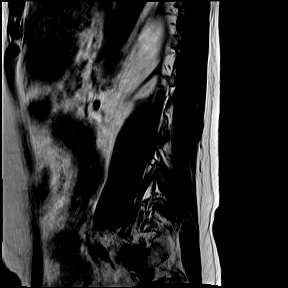
[im 3/13]
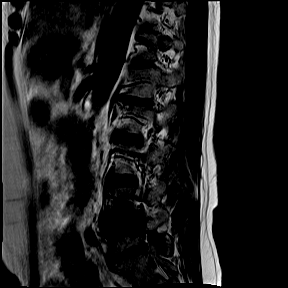
[im 5/13]
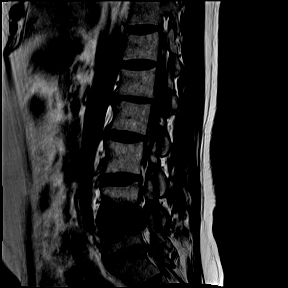
[im 8/13]
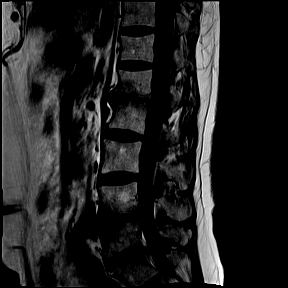
[im 10/13]
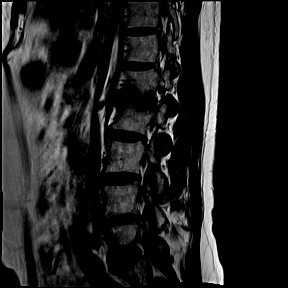
[im 13/13]
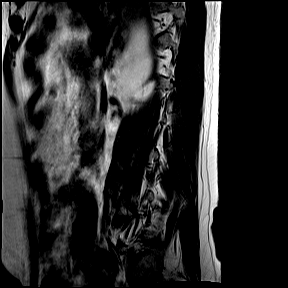

[Series 8: T2 · coronal · 5.0mm · 0.82mm/px · 8 of 18 slices shown (2 of 3)]
[im 1/18]
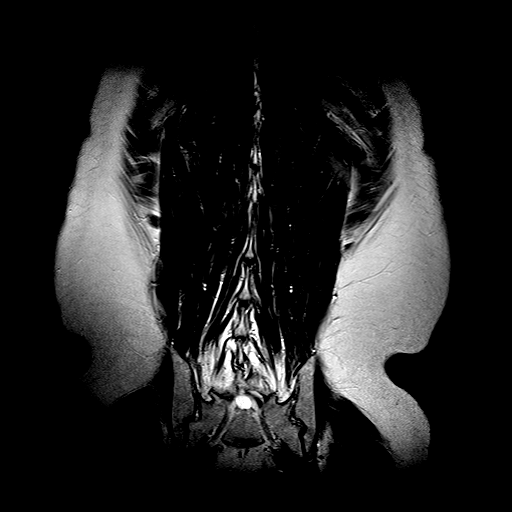
[im 3/18]
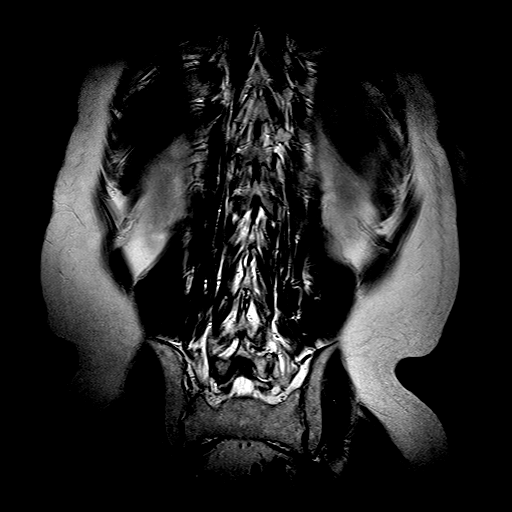
[im 5/18]
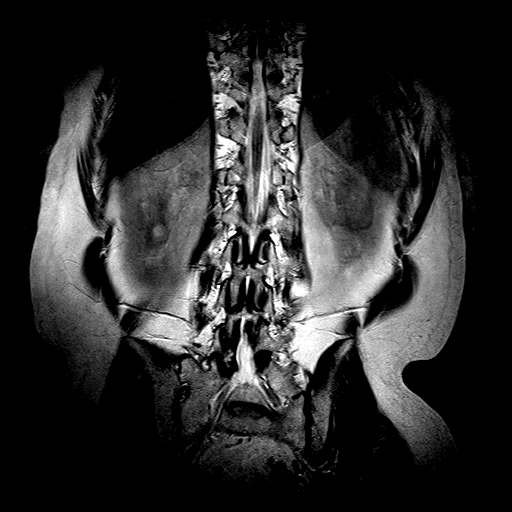
[im 8/18]
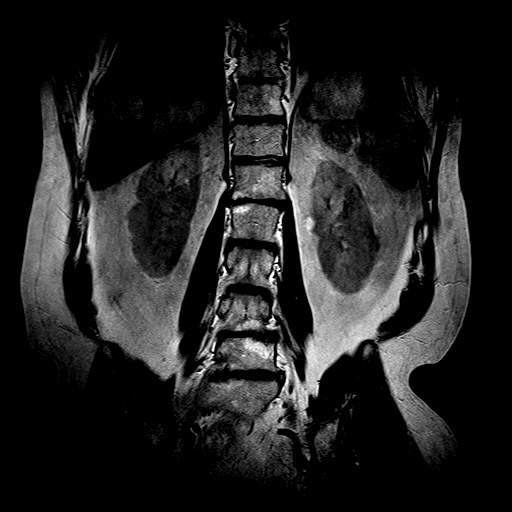
[im 10/18]
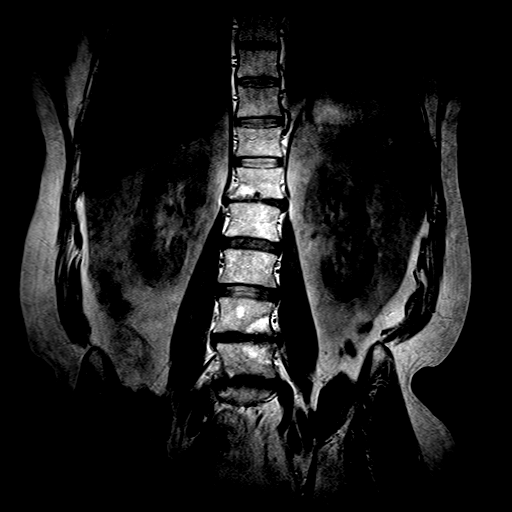
[im 13/18]
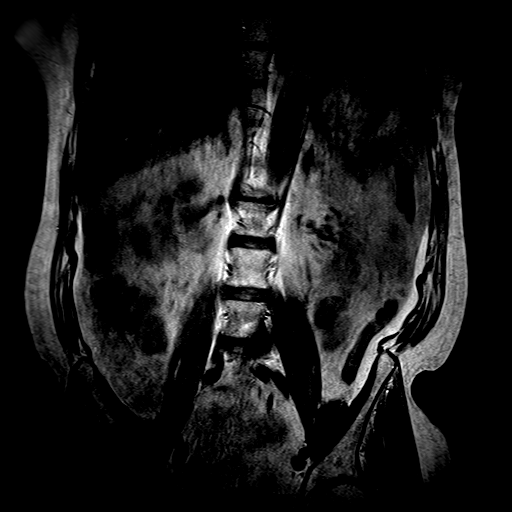
[im 15/18]
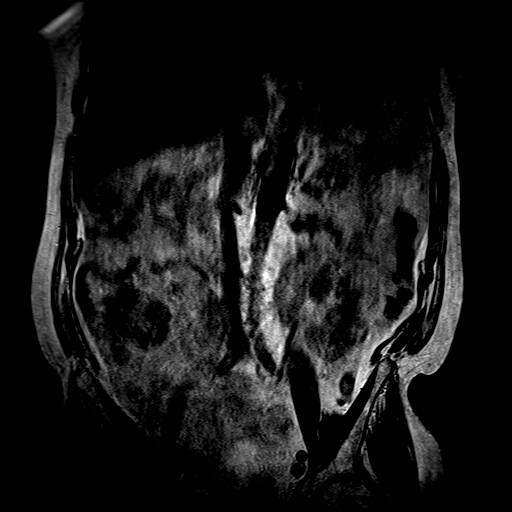
[im 18/18]
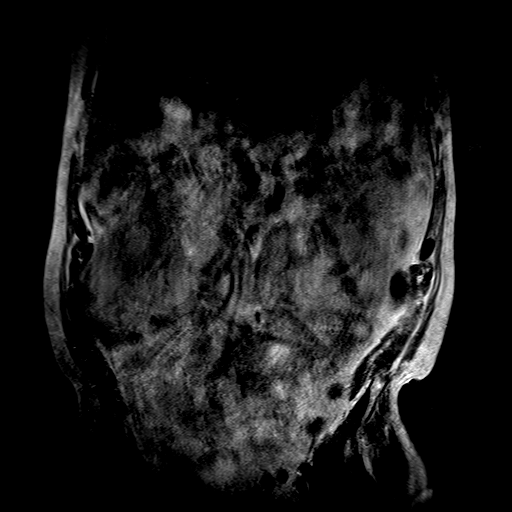

[Series 9: T2 · axial · 4.0mm · 0.52mm/px · z∈[-158,+38]mm · 11 of 23 slices shown (3 of 3)]
[im 1/23]
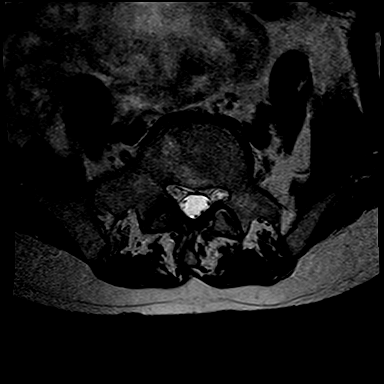
[im 3/23]
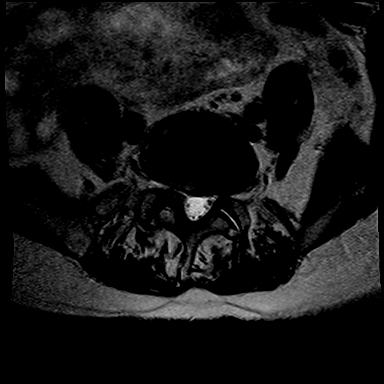
[im 5/23]
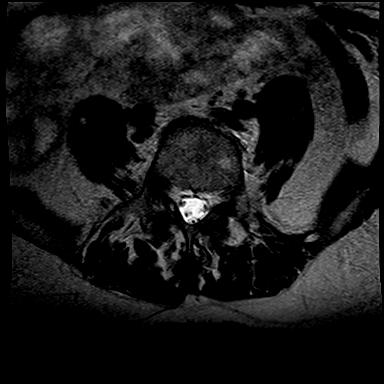
[im 7/23]
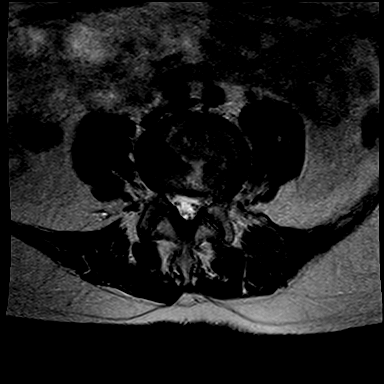
[im 9/23]
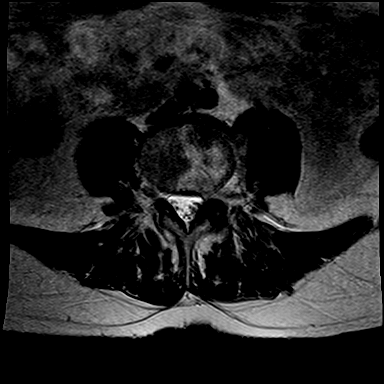
[im 12/23]
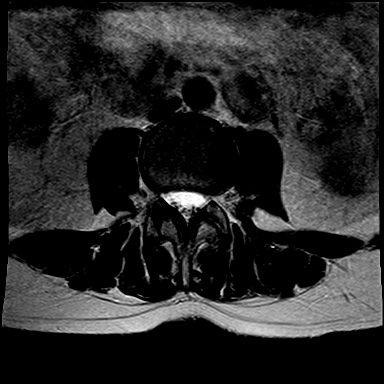
[im 14/23]
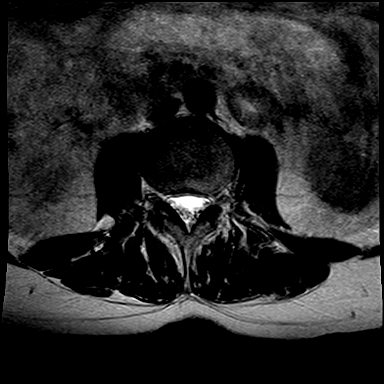
[im 16/23]
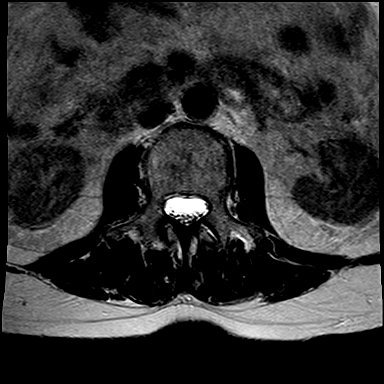
[im 18/23]
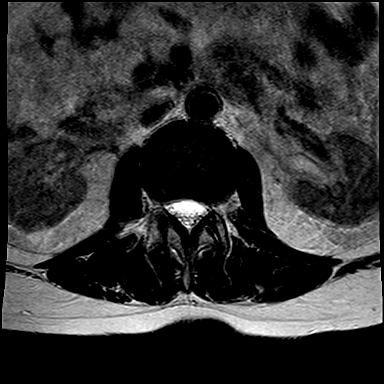
[im 20/23]
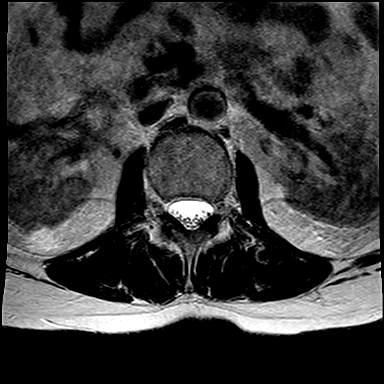
[im 23/23]
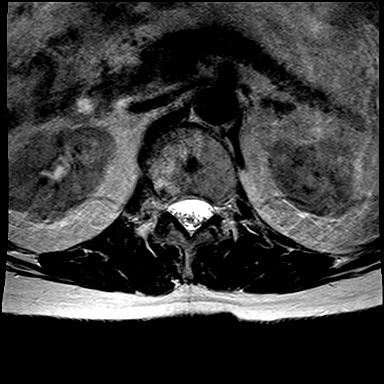

[31 of 48 positions shown; findings below may reference images not displayed]

FINDINGS: Modic type 1 inflammatory changes on both sides of L1-2 and L4-5 discs are noted.

Conus terminates at L1 level. 

At L1-2 level, degenerative disc changes and facet arthropathy are noted causing moderate compromise of both lateral recess and neural foramina. 

At L2-3 level, mild compromise of lateral recess due to degenerative disc disease and facet arthropathy.  Similar finding is noted at L3-4 level. 

At L4-5 level, significant, asymmetric bulging annulus to the left with facet arthropathy is causing severe left foraminal narrowing.  Moderate compromise of right neural foramen is noted. Significant compromise of left lateral recess is also noted at L4-5 level.  Compromise of thecal sac is noted with AP diameter in the midline measuring 9.5 mm. 

At L5-S1 level, significant facet arthropathy is noted with degenerative disc disease causing significant biforaminal stenosis, right more than the left. Mild compromise of thecal sac is noted. 

Paravertebral soft tissues are unremarkable.
IMPRESSION: 1.  Modic type 1 inflammatory changes on both sides of L1-2 and L4-5 discs.

2.  At L4-5 level, significant, asymmetric bulging annulus to the left with facet arthropathy is causing severe left foraminal narrowing.  Moderate compromise of right neural foramen is noted. Significant compromise of left lateral recess is also noted at L4-5 level.  Compromise of thecal sac is noted with AP diameter in the midline measuring 9.5 mm. 

3. Findings at other disc levels are described above in detail.

## 2023-07-28 IMAGING — CT CT ABDOMEN & PELVIS W/O CONTRAST
2 of 4 series · 17 of 46 positions shown, 19 images · non-contrast
Comparison: Ultrasound abdomen 03/25/2022.

﻿EXAM:  CT ABDOMEN & PELVIS W/O CONTRAST
INDICATION: Constipation.  History of hernia repair.
TECHNIQUE: CT scan was performed without IV contrast and after oral contrast and reviewed in multiple projections.  Radiation dose 951 mGy cm. Exam was performed using 1 or more of the following dose reduction techniques: Automated exposure control, adjustment of the mA and/or kV according to patient size, or the use of iterative reconstruction technique.

[Series 8020: axial · axial · 0.86mm/px · z∈[-1414,-955]mm · 14 of 167 slices shown, 16 images]
[im 7/167  soft-tissue]
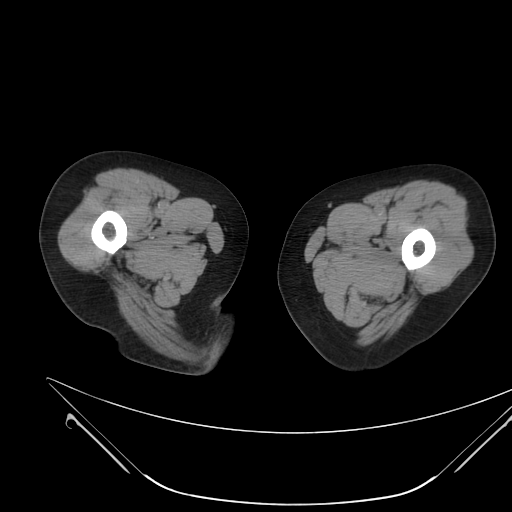
[im 7/167  bone]
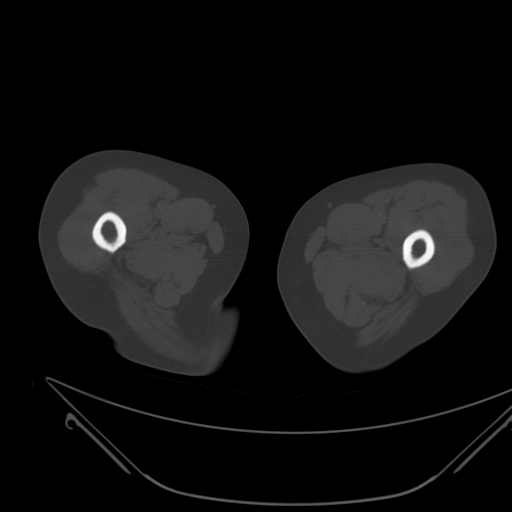
[im 20/167  soft-tissue]
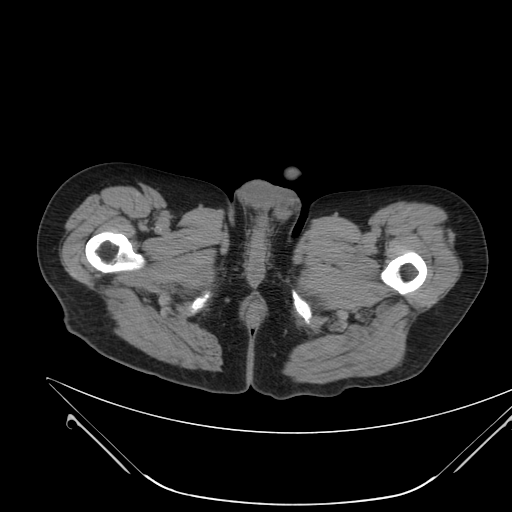
[im 34/167  soft-tissue]
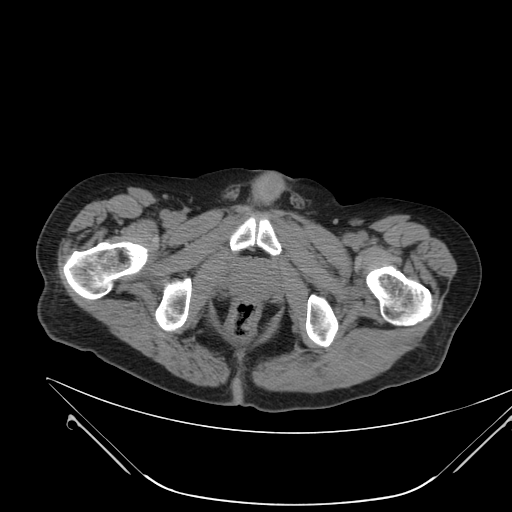
[im 47/167  soft-tissue]
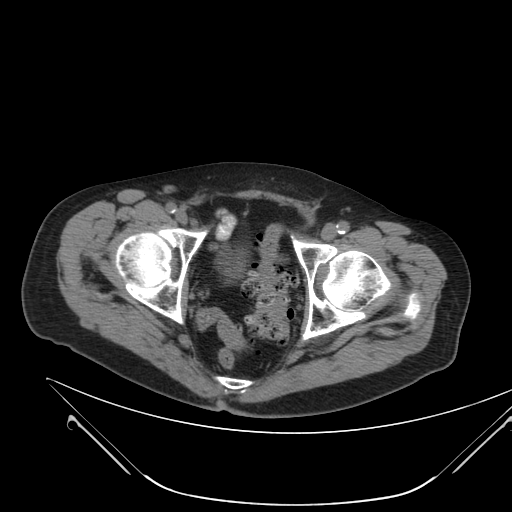
[im 54/167  soft-tissue]
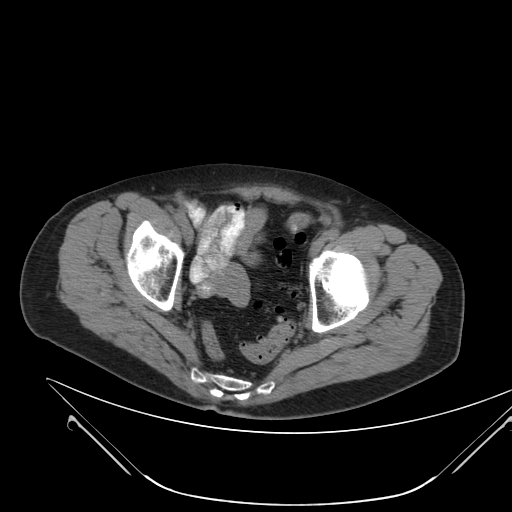
[im 67/167  soft-tissue]
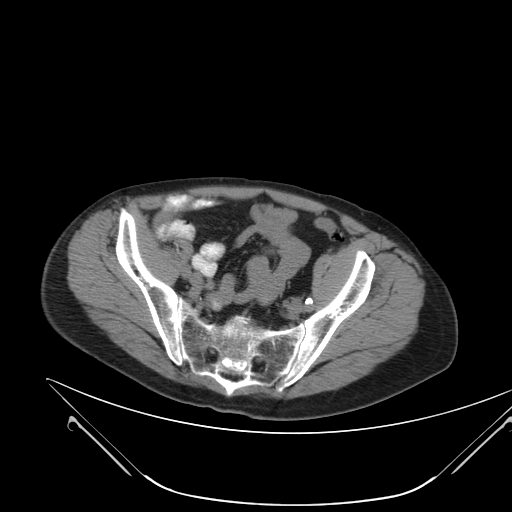
[im 80/167  soft-tissue]
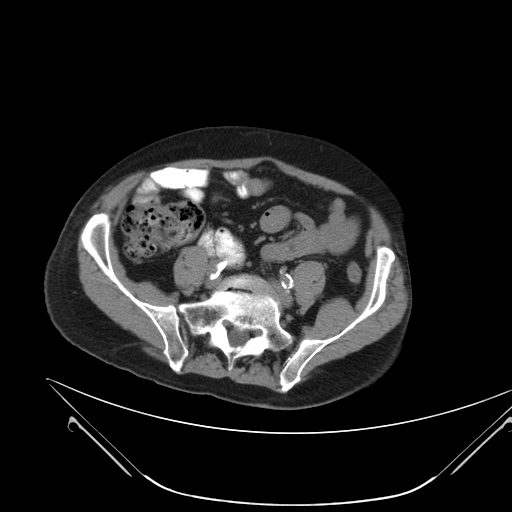
[im 87/167  soft-tissue]
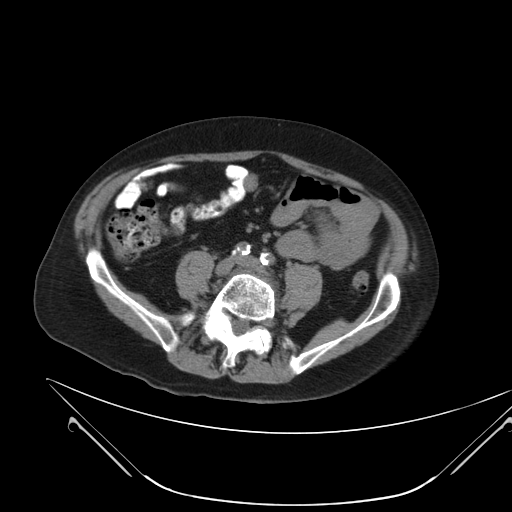
[im 100/167  soft-tissue]
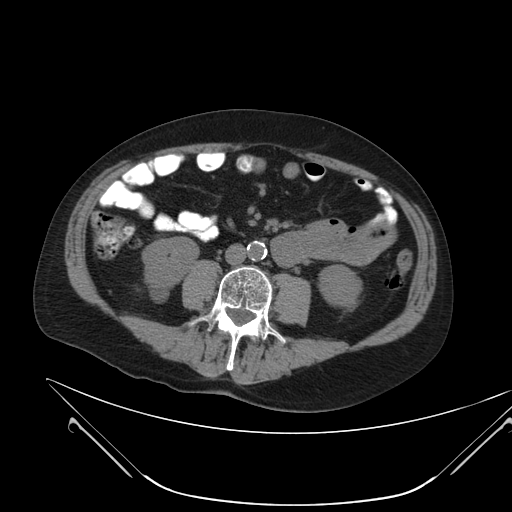
[im 100/167  bone]
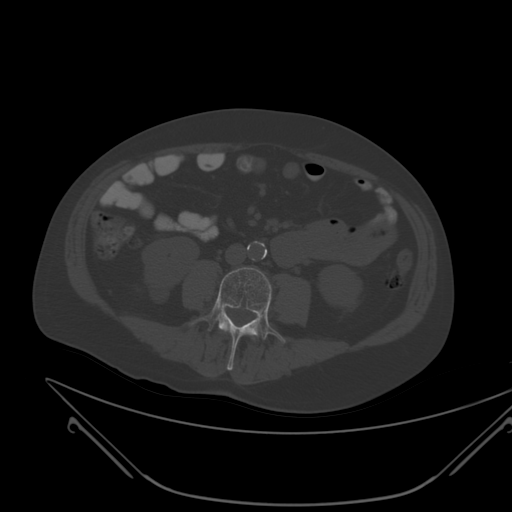
[im 113/167  soft-tissue]
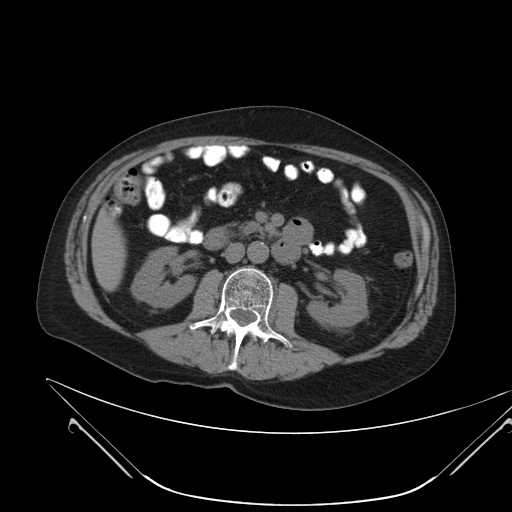
[im 127/167  soft-tissue]
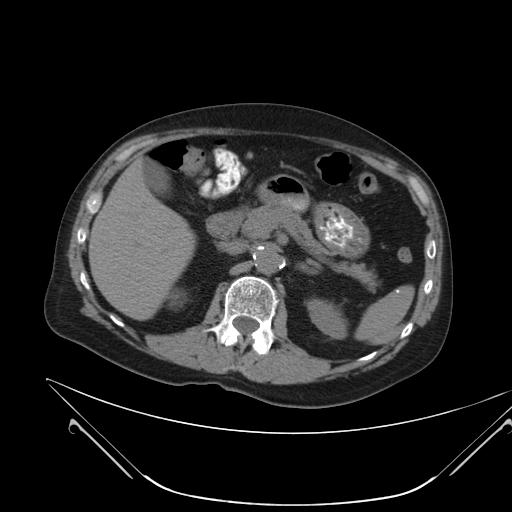
[im 133/167  soft-tissue]
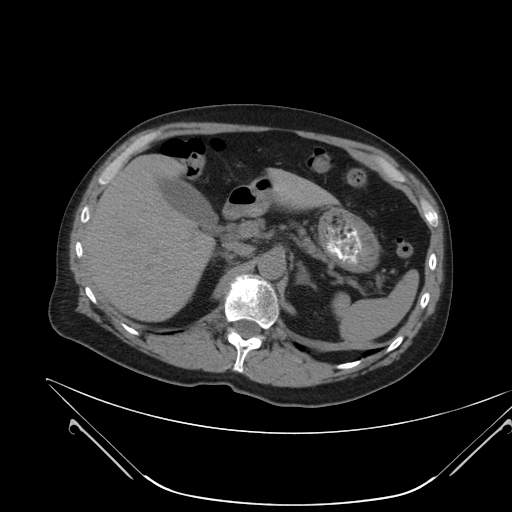
[im 147/167  soft-tissue]
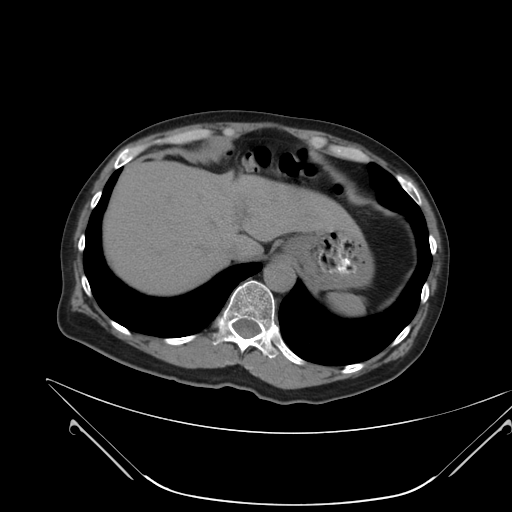
[im 160/167  soft-tissue]
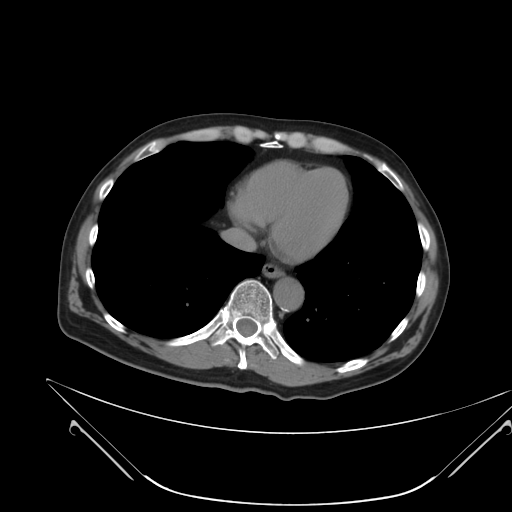

[Series 8021: cor · coronal · 0.98mm/px · 3 of 57 slices shown]
[im 19/57  soft-tissue]
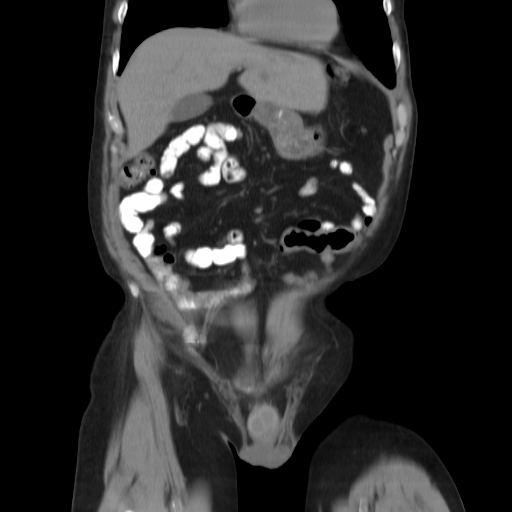
[im 25/57  soft-tissue]
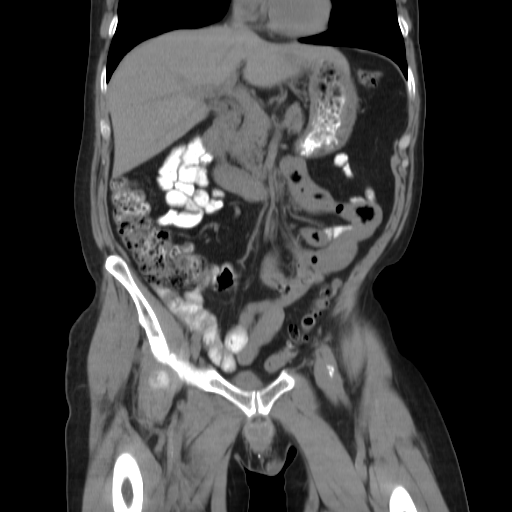
[im 32/57  soft-tissue]
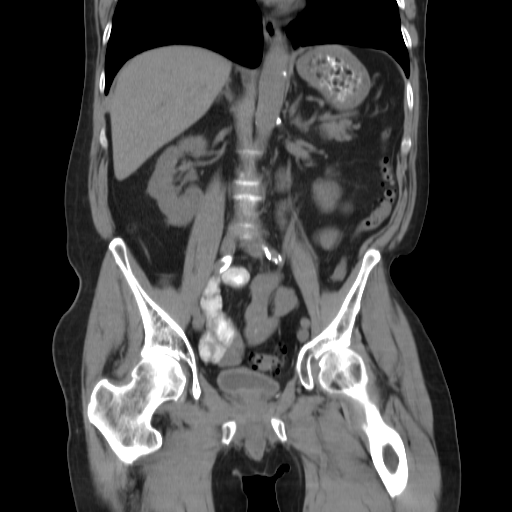

[17 of 46 positions shown; findings below may reference images not displayed]

FINDINGS: Lung bases do not show any focal lesions.  No focal lesions of liver and spleen other than 2 cm size benign cyst of the left lobe.  The gallbladder, pancreas do not show any acute findings. 

A 1.5 cm cyst of the posterior right kidney is noted.  No calculi or obstruction of the kidneys are seen.  No retroperitoneal adenopathy. 

Severe atherosclerotic changes of distal abdominal aorta and proximal iliac arteries.  No evidence of small-bowel obstruction.  Diverticulosis of sigmoid colon is noted. No evidence of diverticulitis.

Severe degenerative disc changes at L1-2, L4-5 and L5-S1 levels.
IMPRESSION: 1. Limited noncontrast examination is not optimal to evaluate solid viscera and vascular structures and neoplasms.

2. No calculi or obstruction of the kidneys.  Low-density nodule from posterior right kidney 1.4 cm in size, likely benign cyst.  Lesion unchanged in appearance from ultrasound examination of 03/25/2022.

3. Severe calcific changes of abdominal aorta and iliac arteries.

4. Diverticulosis of sigmoid colon.  

Electronically Signed by HORVAT, DARIETTO at [id number] [DATE]
